# Patient Record
Sex: Female | Born: 1995
Health system: Southern US, Community
[De-identification: ages and names within clinical notes are randomized; demographics above are authoritative.]

## PROBLEM LIST (undated history)

## (undated) DIAGNOSIS — F32A Depression, unspecified: Secondary | ICD-10-CM

## (undated) DIAGNOSIS — E282 Polycystic ovarian syndrome: Secondary | ICD-10-CM

## (undated) HISTORY — PX: WISDOM TOOTH EXTRACTION: SHX21

---

## 2019-11-02 ENCOUNTER — Other Ambulatory Visit (HOSPITAL_COMMUNITY): Payer: Self-pay | Admitting: Physician Assistant

## 2020-04-23 ENCOUNTER — Other Ambulatory Visit (HOSPITAL_COMMUNITY): Payer: Self-pay | Admitting: Physician Assistant

## 2020-04-28 ENCOUNTER — Encounter: Payer: Self-pay | Admitting: Plastic Surgery

## 2020-04-28 ENCOUNTER — Ambulatory Visit (INDEPENDENT_AMBULATORY_CARE_PROVIDER_SITE_OTHER): Payer: No Typology Code available for payment source | Admitting: Plastic Surgery

## 2020-04-28 ENCOUNTER — Other Ambulatory Visit: Payer: Self-pay

## 2020-04-28 DIAGNOSIS — M542 Cervicalgia: Secondary | ICD-10-CM | POA: Diagnosis not present

## 2020-04-28 DIAGNOSIS — G8929 Other chronic pain: Secondary | ICD-10-CM

## 2020-04-28 DIAGNOSIS — M546 Pain in thoracic spine: Secondary | ICD-10-CM

## 2020-04-28 DIAGNOSIS — N62 Hypertrophy of breast: Secondary | ICD-10-CM | POA: Diagnosis not present

## 2020-04-28 DIAGNOSIS — M549 Dorsalgia, unspecified: Secondary | ICD-10-CM | POA: Insufficient documentation

## 2020-04-28 NOTE — Progress Notes (Signed)
Patient ID: Ashley Page, female    DOB: 1996-01-02, 24 y.o.   MRN: [id number]   Chief Complaint  Patient presents with  . Breast Problem    Mammary Hyperplasia: The patient is a 24 y.o. female with a history of mammary hyperplasia for several years.  She has extremely large breasts causing symptoms that include the following: Back pain in the upper and lower back, including neck pain. She pulls or pins her bra straps to provide better lift and relief of the pressure and pain. She notices relief by holding her breast up manually.  Her shoulder straps cause grooves and pain and pressure that requires padding for relief. Pain medication is sometimes required with motrin and tylenol.  Activities that are hindered by enlarged breasts include: exercise and running.  She has tried supportive clothing as well as fitted bras without improvement.  Her breasts are extremely large and fairly symmetric.  She has hyperpigmentation of the inframammary area on both sides.  The sternal to nipple distance on the right is 42 cm and the left is 41 cm.  The IMF distance is 13 cm.  She is 5 feet 4 inches tall and weighs 215 pounds.  Preoperative bra size = 38H (N) cup.  The estimated excess breast tissue to be removed at the time of surgery = 630 grams on the left and 630 grams on the right.  Mammogram history: none.  Family history of breast cancer:  none.  Tobacco use:  None and no DM.  She has grade III ptosis.  No masses or areas of concern.  Frequent rashes that require creams and powder.    Review of Systems  Constitutional: Positive for activity change. Negative for appetite change.  HENT: Negative.   Eyes: Negative.   Respiratory: Negative.  Negative for chest tightness and shortness of breath.   Cardiovascular: Negative for leg swelling.  Gastrointestinal: Negative for abdominal distention and abdominal pain.  Endocrine: Negative.   Genitourinary: Negative.   Musculoskeletal: Positive for back  pain and neck pain.  Skin: Positive for rash. Negative for pallor and wound.  Neurological: Negative.   Hematological: Negative.   Psychiatric/Behavioral: Negative.     History reviewed. No pertinent past medical history.  History reviewed. No pertinent surgical history.    Current Outpatient Medications:  .  levonorgestrel-ethinyl estradiol (ALESSE) 0.1-20 MG-MCG tablet, Take 1 tablet by mouth daily., Disp: , Rfl:  .  sertraline (ZOLOFT) 50 MG tablet, Take 50 mg by mouth daily., Disp: , Rfl:    Objective:   Vitals:   04/28/20 0832  BP: 115/80  Pulse: 80  Temp: 98.4 F (36.9 C)  SpO2: 98%    Physical Exam Vitals and nursing note reviewed.  Constitutional:      Appearance: Normal appearance.  HENT:     Head: Normocephalic and atraumatic.  Eyes:     Extraocular Movements: Extraocular movements intact.  Cardiovascular:     Rate and Rhythm: Normal rate.     Pulses: Normal pulses.  Pulmonary:     Effort: Pulmonary effort is normal. No respiratory distress.  Abdominal:     General: Abdomen is flat.  Skin:    General: Skin is warm.  Neurological:     General: No focal deficit present.     Mental Status: She is alert and oriented to person, place, and time.  Psychiatric:        Mood and Affect: Mood normal.        Behavior:  Behavior normal.        Thought Content: Thought content normal.        Judgment: Judgment normal.     Assessment & Plan:  Neck pain  Chronic bilateral thoracic back pain  Symptomatic mammary hypertrophy  The patient is a good candidate for breast reduction with possible liposuction.  We will send her to physical therapy and she knows to call the office when she has completed it.  We discussed that she may have nipple areola sensation changes and an inability to breast-feed in the future with any breast surgery.  Patient is aware and this does not deter her.  We will send her the breast reduction pamphlet.  Pictures were obtained of the  patient and placed in the chart with the patient's or guardian's permission.   Alena Bills Deronda Christian, DO

## 2020-04-29 ENCOUNTER — Institutional Professional Consult (permissible substitution): Payer: Self-pay | Admitting: Plastic Surgery

## 2020-05-20 ENCOUNTER — Ambulatory Visit: Payer: No Typology Code available for payment source | Attending: Plastic Surgery | Admitting: Physical Therapy

## 2020-05-20 ENCOUNTER — Encounter: Payer: Self-pay | Admitting: Physical Therapy

## 2020-05-20 ENCOUNTER — Other Ambulatory Visit: Payer: Self-pay

## 2020-05-20 DIAGNOSIS — M546 Pain in thoracic spine: Secondary | ICD-10-CM | POA: Insufficient documentation

## 2020-05-20 DIAGNOSIS — M542 Cervicalgia: Secondary | ICD-10-CM | POA: Insufficient documentation

## 2020-05-20 DIAGNOSIS — M6281 Muscle weakness (generalized): Secondary | ICD-10-CM | POA: Insufficient documentation

## 2020-05-20 NOTE — Therapy (Addendum)
[facility] [address] [place], [place], [address] Phone: [phone]   Fax:  [phone]  Physical Therapy Evaluation  Patient Details  Name: Ashley Page MRN: [id number] Date of Birth: 12-29-1995 Referring Provider (PT): Peggye Form, DO    Encounter Date: 05/20/2020   PT End of Session - 05/20/20 0924    Visit Number 1    Number of Visits 6    Authorization Type [facility] Focus plan    Authorization - Number of Visits --    PT Start Time 0845    PT Stop Time 0925    PT Time Calculation (min) 40 min    Activity Tolerance Patient tolerated treatment well    Behavior During Therapy [facility] for tasks assessed/performed           History reviewed. No pertinent past medical history.  History reviewed. No pertinent surgical history.  There were no vitals filed for this visit.    Subjective Assessment - 05/20/20 0849    Subjective "Scheduled to have mammary hypertrophy surgery. I'm Having a lot of back pain in the middle of my back, as well as shoulder pain. hurts worst after work."    Patient Stated Goals Reduce pain, be able to resume personal exercise program    Currently in Pain? Yes    Pain Score 4    At best 0/10, at worst 7/10 usually after work   Pain Location Back    Pain Orientation Right;Left;Mid    Pain Descriptors / Indicators Aching    Pain Type Chronic pain    Pain Onset More than a month ago   Approx a year ago, worsening over time.   Pain Frequency Constant    Aggravating Factors  Working    Pain Relieving Factors Laying down              [facility] PT Assessment - 05/20/20 0001      Assessment   Medical Diagnosis Neck pain M54.2, Chronic bilateral thoracic back pain M54.6, G89.29, Symptomatic mammary hypertrophy N62    Referring Provider (PT) Dillingham, Alena Bills, DO     Onset Date/Surgical Date --   1 year ago   Hand Dominance Right    Next MD Visit approximately 3 weeks    Prior Therapy No        Precautions   Precautions None      Restrictions   Weight Bearing Restrictions No      Balance Screen   Has the patient fallen in the past 6 months No    Has the patient had a decrease in activity level because of a fear of falling?  No    Is the patient reluctant to leave their home because of a fear of falling?  No      Home Nurse, mental health Private residence    Living Arrangements Parent    Available Help at Discharge Family    Type of Home Apartment    Home Access Level entry    Home Layout Two level    Alternate Level Stairs-Number of Steps 16    Alternate Level Stairs-Rails Right      Prior Function   Level of Independence Independent    Vocation Full time employment    Art therapist, standing, twisting, lifting, fine hand movements    Leisure Drawing/sketching, would like to exercise more       Cognition   Overall Cognitive Status Within Functional Limits for tasks  assessed      Observation/Other Assessments   Focus on Therapeutic Outcomes (FOTO)  not required      Posture/Postural Control   Posture/Postural Control Postural limitations    Postural Limitations Rounded Shoulders;Forward head;Decreased thoracic kyphosis      ROM / Strength   AROM / PROM / Strength AROM;Strength      AROM   Overall AROM  Within functional limits for tasks performed    AROM Assessment Site Cervical;Shoulder    Right/Left Shoulder --    Cervical Flexion 60    Cervical Extension 60    Cervical - Right Side Bend 50    Cervical - Left Side Bend 40    Cervical - Right Rotation 65    Cervical - Left Rotation 65      Strength   Overall Strength Within functional limits for tasks performed    Strength Assessment Site Shoulder    Right/Left Shoulder Right;Left    Right Shoulder Flexion 5/5    Right Shoulder Extension 5/5    Right Shoulder ABduction 4/5    Right Shoulder Internal Rotation 4+/5    Right Shoulder External Rotation 4+/5    Left  Shoulder Flexion 5/5    Left Shoulder Extension 5/5    Left Shoulder ABduction 4+/5    Left Shoulder Internal Rotation 4+/5    Left Shoulder External Rotation 4+/5      Palpation   Palpation comment TTP to bilateral UT, LS, and supraspinatus                      Objective measurements completed on examination: See above findings.       [facility] Adult PT Treatment/Exercise - 05/20/20 0001      Self-Care   Self-Care Other Self-Care Comments    Other Self-Care Comments  Tennis ball self-TP release with wall for UT, rhomboid, LS      Exercises   Exercises Neck      Shoulder Exercises: Seated   Retraction AROM;Strengthening;Both;10 reps    Retraction Limitations Scapular retractions, VC for down and in    External Rotation AROM;Strengthening;Left;20 reps;Theraband    Theraband Level (Shoulder External Rotation) Level 2 (Red)      Shoulder Exercises: Stretch   Other Shoulder Stretches Rhomboid clasped hands stretch 2x30s; Doorway rhomboid stretch 1x30s      Manual Therapy   Manual Therapy Soft tissue mobilization    Soft tissue mobilization Bilateral UT TP release      Neck Exercises: Stretches   Upper Trapezius Stretch 1 rep;Right;Left;30 seconds    Levator Stretch Right;Left;1 rep;30 seconds                  PT Education - 05/20/20 1126    Education Details Process of physical therapy, reviewed HEP, importance of stretching after work    [employer]) Educated Patient    Methods Explanation    Comprehension Verbalized understanding            PT Short Term Goals - 05/20/20 1136      PT SHORT TERM GOAL #1   Title Pt will be IND with HEP    Time 3    Status New    Target Date 06/10/20      PT SHORT TERM GOAL #2   Title --    Time --    Period --    Status --    Target Date --  PT Long Term Goals - 05/20/20 1138      PT LONG TERM GOAL #1   Title Pt will be IND with all provided HEP in order to resume personal workout  routine    Time 6    Period Weeks    Target Date 07/01/20      PT LONG TERM GOAL #2   Title Pt will improve gross shoulder strength bilaterally to >/= 4+/5 to improve work tolerance    Time 6    Period Weeks    Status New    Target Date 07/01/20      PT LONG TERM GOAL #3   Title Pt will be educated on and demonstrate improved shoulder and thoracic spine posture and functional mobility to improve work ability and quality of life    Time 6    Period Weeks    Status New    Target Date 07/01/20      PT LONG TERM GOAL #4   Title Pt will report reduced pain after work </=5/10 pain to promote functional independence with ADLs    Time 6    Period Weeks    Status New    Target Date 07/01/20                  Plan - 05/20/20 1130    Clinical Impression Statement Pt is a 24 year-old female who presents to OPPT prior to mammary hypertrophy surgery with neck, shoulder, and back pain of approximately 1 year in duration. Pt demonstrates cervical, shoulder, and lumbar ROM that is within functional limits and some gross shoulder weakness bilaterally. Increased muscle tension in posterior scapulothoracic musculature with notable trigger points bilaterally was noted. Pt will benefit from therapy for postural education and training, increase strength, and reduce pain.    Examination-Activity Limitations Other;Caring for Others   Unable to perform personal workout routine   Stability/Clinical Decision Making Stable/Uncomplicated    Clinical Decision Making Low    Rehab Potential Good    PT Frequency 1x / week    PT Duration 6 weeks    PT Treatment/Interventions ADLs/Self Care Home Management;Cryotherapy;Electrical Stimulation;Moist Heat;Iontophoresis 4mg /ml Dexamethasone;Functional mobility training;Therapeutic activities;Therapeutic exercise;Manual techniques;Patient/family education;Dry needling;Joint Manipulations;Spinal Manipulations;Vasopneumatic Device    PT Next Visit Plan Review  HEP/progress, discuss goals of personal exercise plan (gym? aerobic? weight managment?) and educate    PT Home Exercise Plan [id number] - UT and LS stretches, scapular retraction, ER with red band    Consulted and Agree with Plan of Care Patient           Patient will benefit from skilled therapeutic intervention in order to improve the following deficits and impairments:  Decreased activity tolerance, Decreased strength, Increased muscle spasms, Impaired perceived functional ability, Postural dysfunction, Improper body mechanics, Pain  Visit Diagnosis: Cervicalgia  Pain in thoracic spine  Muscle weakness (generalized)     Problem List Patient Active Problem List   Diagnosis Date Noted  . Neck pain 04/28/2020  . Back pain 04/28/2020  . Symptomatic mammary hypertrophy 04/28/2020    13/01/2020, SPT 05/20/2020, 12:42 PM  [facility] [address] [place], [place], [place] Phone: [phone]   Fax:  [phone]  Name: Ashley Page MRN: Jonetta Speak Date of Birth: [birth year]

## 2020-05-26 ENCOUNTER — Ambulatory Visit: Payer: No Typology Code available for payment source | Attending: Plastic Surgery

## 2020-05-26 ENCOUNTER — Other Ambulatory Visit: Payer: Self-pay

## 2020-05-26 DIAGNOSIS — M546 Pain in thoracic spine: Secondary | ICD-10-CM | POA: Insufficient documentation

## 2020-05-26 DIAGNOSIS — M542 Cervicalgia: Secondary | ICD-10-CM | POA: Insufficient documentation

## 2020-05-26 DIAGNOSIS — M6281 Muscle weakness (generalized): Secondary | ICD-10-CM | POA: Diagnosis present

## 2020-05-26 NOTE — Therapy (Signed)
[facility] Rehabilitation [facility] [address] [place], [place], [address] Phone: [phone]   Fax:  [phone]  Physical Therapy Treatment  Patient Details  Name: Melane Windholz MRN: [id number] Date of Birth: 01-Nov-1995 Referring Provider (PT): Peggye Form, DO    Encounter Date: 05/26/2020   PT End of Session - 05/26/20 1129    Visit Number 2    Number of Visits 6    Authorization Type [facility] Focus plan    PT Start Time 1130    PT Stop Time 1210    PT Time Calculation (min) 40 min    Activity Tolerance Patient tolerated treatment well    Behavior During Therapy [facility] for tasks assessed/performed           History reviewed. No pertinent past medical history.  History reviewed. No pertinent surgical history.  There were no vitals filed for this visit.   Subjective Assessment - 05/26/20 1128    Subjective Patient denies pain aside from "maybe between a 0 and 1/10 because I can just tell it's there" in her mid upper thoracic spine. She verbalizes compliance with HEP and expresses having relief of pain and some soreness after performing interventions.    Patient Stated Goals Reduce pain, be able to resume personal exercise program    Currently in Pain? Yes    Pain Score 1     Pain Location Back    Pain Orientation Mid;Right;Left    Pain Descriptors / Indicators Aching    Pain Type Chronic pain    Pain Onset More than a month ago   Approx a year ago, worsening over time.             [facility] PT Assessment - 05/26/20 0001      Assessment   Medical Diagnosis Neck pain M54.2, Chronic bilateral thoracic back pain M54.6, G89.29, Symptomatic mammary hypertrophy N62    Referring Provider (PT) Dillingham, Alena Bills, DO                          [facility] Adult PT Treatment/Exercise - 05/26/20 0001      Self-Care   Self-Care Other Self-Care Comments    Other Self-Care Comments  Reviewed HEP      Neck Exercises: Machines for  Strengthening   UBE (Upper Arm Bike) L2.5 x 2 min forward and 2 min backward      Neck Exercises: Prone   Other Prone Exercise Quadruped thread the needle x 10 each UE with cues for core activation and posture    Other Prone Exercise Quadruped cat/camel x 15      Shoulder Exercises: Seated   Retraction AROM;Strengthening;Both;10 reps    Retraction Limitations Scapular retractions, VC for down and in      Shoulder Exercises: Standing   Protraction AROM;Strengthening;Both;20 reps    Protraction Limitations Plantigrade position against counter for push up plus x 20    External Rotation AROM;Strengthening;Both;20 reps;Theraband    Theraband Level (Shoulder External Rotation) Level 2 (Red)    External Rotation Limitations Against wall    Extension AROM;Strengthening;Both;20 reps;Theraband    Theraband Level (Shoulder Extension) Level 3 (Green)    Extension Limitations Cued to perform scapular retraction with extension    Row AROM;Strengthening;Both;20 reps;Theraband    Theraband Level (Shoulder Row) Level 3 (Green)    Other Standing Exercises Serratus anterior wall slides with roller x 20    Other Standing Exercises Pallof press with rotation using green  theraband 10x each direction for core strengthening      Shoulder Exercises: Stretch   Other Shoulder Stretches Doorway rhomboid stretch 2 x 30 sec      Manual Therapy   Manual Therapy Soft tissue mobilization    Manual therapy comments Myofascial release of R rhomboid    Soft tissue mobilization STM B upper traps and L levator scap and passive B upper trap stretch x 30 sec each      Neck Exercises: Stretches   Corner Stretch 2 reps;30 seconds                  PT Education - 05/26/20 1239    Education Details Reviewed HEP and encouraged pt to perform 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    Person(s) Educated Patient    Methods  Explanation;Demonstration    Comprehension Verbalized understanding;Returned demonstration            PT Short Term Goals - 05/20/20 1136      PT SHORT TERM GOAL #1   Title Pt will be IND with HEP    Time 3    Status New    Target Date 06/10/20      PT SHORT TERM GOAL #2   Title --    Time --    Period --    Status --    Target Date --             PT Long Term Goals - 05/20/20 1138      PT LONG TERM GOAL #1   Title Pt will be IND with all provided HEP in order to resume personal workout routine    Time 6    Period Weeks    Target Date 07/01/20      PT LONG TERM GOAL #2   Title Pt will improve gross shoulder strength bilaterally to >/= 4+/5 to improve work tolerance    Time 6    Period Weeks    Status New    Target Date 07/01/20      PT LONG TERM GOAL #3   Title Pt will be educated on and demonstrate improved shoulder and thoracic spine posture and functional mobility to improve work ability and quality of life    Time 6    Period Weeks    Status New    Target Date 07/01/20      PT LONG TERM GOAL #4   Title Pt will report reduced pain after work </=5/10 pain to promote functional independence with ADLs    Time 6    Period Weeks    Status New    Target Date 07/01/20                 Plan - 05/26/20 1129    Clinical Impression Statement Patient reports her pain that was present upon arrival eased while performing interventions. She tolerated addition of exercises well with no complaints of pain. She will continue to benefit from postural education and periscapular strengthening for improved tolerance and reduced symptoms during work shifts.    Examination-Activity Limitations Other;Caring for Others   Unable to perform personal workout routine   Stability/Clinical Decision Making Stable/Uncomplicated    Rehab Potential Good    PT Frequency 1x / week    PT Duration 6 weeks    PT Treatment/Interventions ADLs/Self Care Home  Management;Cryotherapy;Electrical Stimulation;Moist Heat;Iontophoresis 4mg /ml Dexamethasone;Functional mobility training;Therapeutic activities;Therapeutic exercise;Manual techniques;Patient/family education;Dry  needling;Joint Manipulations;Spinal Manipulations;Vasopneumatic Device    PT Next Visit Plan Review HEP/progress (update next session and provide pt with green theraband), discuss goals of personal exercise plan (gym? aerobic? 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 Neck pain 04/28/2020  . Back pain 04/28/2020  . Symptomatic mammary hypertrophy 04/28/2020      Rhea Bleacher, PT, DPT 05/26/20 12:48 PM  [facility] [address] [place], [place], [address] Phone: [phone]   Fax:  [phone]  Name: Clela Hagadorn MRN: [id number] Date of Birth: [birth year]

## 2020-06-06 ENCOUNTER — Encounter: Payer: Self-pay | Admitting: Physical Therapy

## 2020-06-06 ENCOUNTER — Other Ambulatory Visit: Payer: Self-pay

## 2020-06-06 ENCOUNTER — Ambulatory Visit: Payer: No Typology Code available for payment source | Admitting: Physical Therapy

## 2020-06-06 DIAGNOSIS — M546 Pain in thoracic spine: Secondary | ICD-10-CM

## 2020-06-06 DIAGNOSIS — M542 Cervicalgia: Secondary | ICD-10-CM

## 2020-06-06 DIAGNOSIS — M6281 Muscle weakness (generalized): Secondary | ICD-10-CM

## 2020-06-06 NOTE — Therapy (Signed)
[facility] [address] [place], [place], [address] Phone: [phone]   Fax:  [phone]  Physical Therapy Treatment  Patient Details  Name: Ashley Page MRN: [id number] Date of Birth: [birth year] Referring Provider (PT): Peggye Form, DO    Encounter Date: 06/06/2020   PT End of Session - 06/06/20 0848    Visit Number 3    Number of Visits 6    Date for PT Re-Evaluation 07/01/20    Authorization Type [facility] Focus plan    PT Start Time 0848    PT Stop Time 0928    PT Time Calculation (min) 40 min    Activity Tolerance Patient tolerated treatment well    Behavior During Therapy [facility] for tasks assessed/performed           History reviewed. No pertinent past medical history.  History reviewed. No pertinent surgical history.  There were no vitals filed for this visit.   Subjective Assessment - 06/06/20 0849    Subjective " I think things are getting more calmed down. Work still gives me issue prolonged standing."    Patient Stated Goals Reduce pain, be able to resume personal exercise program    Currently in Pain? Yes    Pain Score 2     Pain Location Back    Pain Orientation Right;Left    Pain Descriptors / Indicators Aching    Pain Type Chronic pain    Pain Onset More than a month ago    Pain Frequency Intermittent    Aggravating Factors  working, and getting out of bed quickly.    Pain Relieving Factors laying down              [facility] PT Assessment - 06/06/20 0001      Assessment   Medical Diagnosis Neck pain M54.2, Chronic bilateral thoracic back pain M54.6, G89.29, Symptomatic mammary hypertrophy N62    Referring Provider (PT) Dillingham, Alena Bills, DO                          [facility] Adult PT Treatment/Exercise - 06/06/20 0001      Self-Care   Other Self-Care Comments  how to perform trigger release and how it can be peformed using tools at home.      Neck Exercises: Machines for  Strengthening   UBE (Upper Arm Bike) --   fwd/bwd x 3 min ea.   Nustep L6 x UE/LE      Neck Exercises: Theraband   Shoulder Extension 15 reps;Blue   x 2 sets   Rows 15 reps;Blue   2 x15     Lumbar Exercises: Stretches   Other Lumbar Stretch Exercise low back stretch rolling hands out on physioball 2 x 30      Lumbar Exercises: Supine   Dead Bug 5 reps   10 seconds     Manual Therapy   Manual therapy comments MTPR along R upper trap / levator scapulae and rhomboids      Neck Exercises: Stretches   Upper Trapezius Stretch 1 rep;Left;Right    Levator Stretch 1 rep;Left;Right;30 seconds    Other Neck Stretches rhomboid stretch from door knob with hands crossed 2 x 30 sec                  PT Education - 06/06/20 0904    Education Details reviewed self trigger point release.    Person(s) Educated Patient  Methods Explanation;Verbal cues    Comprehension Verbalized understanding;Verbal cues required            PT Short Term Goals - 05/20/20 1136      PT SHORT TERM GOAL #1   Title Pt will be IND with HEP    Time 3    Status New    Target Date 06/10/20      PT SHORT TERM GOAL #2   Title --    Time --    Period --    Status --    Target Date --             PT Long Term Goals - 05/20/20 1138      PT LONG TERM GOAL #1   Title Pt will be IND with all provided HEP in order to resume personal workout routine    Time 6    Period Weeks    Target Date 07/01/20      PT LONG TERM GOAL #2   Title Pt will improve gross shoulder strength bilaterally to >/= 4+/5 to improve work tolerance    Time 6    Period Weeks    Status New    Target Date 07/01/20      PT LONG TERM GOAL #3   Title Pt will be educated on and demonstrate improved shoulder and thoracic spine posture and functional mobility to improve work ability and quality of life    Time 6    Period Weeks    Status New    Target Date 07/01/20      PT LONG TERM GOAL #4   Title Pt will report  reduced pain after work </=5/10 pain to promote functional independence with ADLs    Time 6    Period Weeks    Status New    Target Date 07/01/20                 Plan - 06/06/20 0914    Clinical Impression Statement pt reports that she feels she is making good progress with physical therapy. some soreness noted today due getting out of bed quickly. She responded well to trigger point release for upper trap / levator and rhomboids. continue focus on posterior chain strengthening which she continued to responde well to. end of session she reported decreased pain/ stiffness.    PT Treatment/Interventions ADLs/Self Care Home Management;Cryotherapy;Electrical Stimulation;Moist Heat;Iontophoresis 4mg /ml Dexamethasone;Functional mobility training;Therapeutic activities;Therapeutic exercise;Manual techniques;Patient/family education;Dry needling;Joint Manipulations;Spinal Manipulations;Vasopneumatic Device    PT Next Visit Plan Review HEP/progress (update next session and provide pt with green theraband), discuss goals of personal exercise plan (gym? aerobic? 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    PT Home Exercise Plan [id number] - UT and LS stretches, scapular retraction, ER with red band    Consulted and Agree with Plan of Care Patient           Patient will benefit from skilled therapeutic intervention in order to improve the following deficits and impairments:  Decreased activity tolerance,Decreased strength,Increased muscle spasms,Impaired perceived functional ability,Postural dysfunction,Improper body mechanics,Pain  Visit Diagnosis: Cervicalgia  Pain in thoracic spine  Muscle weakness (generalized)     Problem List Patient Active Problem List   Diagnosis Date Noted  . Neck pain 04/28/2020  . Back pain  04/28/2020  . Symptomatic mammary hypertrophy 04/28/2020   13/01/2020 PT,  DPT, LAT, ATC  06/06/20  9:31 AM      [facility] [address] [place], [place], [address] Phone: [phone]   Fax:  [phone]  Name: Morgann Woodburn MRN: [id number] Date of Birth: Sep 21, 1995

## 2020-06-12 ENCOUNTER — Other Ambulatory Visit: Payer: Self-pay

## 2020-06-12 ENCOUNTER — Ambulatory Visit: Payer: No Typology Code available for payment source | Admitting: Physical Therapy

## 2020-06-12 ENCOUNTER — Encounter: Payer: Self-pay | Admitting: Physical Therapy

## 2020-06-12 DIAGNOSIS — M546 Pain in thoracic spine: Secondary | ICD-10-CM

## 2020-06-12 DIAGNOSIS — M542 Cervicalgia: Secondary | ICD-10-CM

## 2020-06-12 DIAGNOSIS — M6281 Muscle weakness (generalized): Secondary | ICD-10-CM

## 2020-06-12 NOTE — Therapy (Signed)
[facility] [address] [place], [place], [address] Phone: [phone]   Fax:  [phone]  Physical Therapy Treatment  Patient Details  Name: Ashley Page MRN: [id number] Date of Birth: Nov 16, 1995 Referring Provider (PT): Peggye Form, DO    Encounter Date: 06/12/2020   PT End of Session - 06/12/20 1501    Visit Number 4    Number of Visits 6    Date for PT Re-Evaluation 07/01/20    Authorization Type [facility] Focus plan    PT Start Time 1501    PT Stop Time 1542    PT Time Calculation (min) 41 min    Activity Tolerance Patient tolerated treatment well           History reviewed. No pertinent past medical history.  History reviewed. No pertinent surgical history.  There were no vitals filed for this visit.   Subjective Assessment - 06/12/20 1501    Subjective "I am very tired with work,              [facility] PT Assessment - 06/12/20 0001      Assessment   Medical Diagnosis Neck pain M54.2, Chronic bilateral thoracic back pain M54.6, G89.29, Symptomatic mammary hypertrophy N62    Referring Provider (PT) Dillingham, Alena Bills, DO                          [facility] Adult PT Treatment/Exercise - 06/12/20 0001      Neck Exercises: Machines for Strengthening   UBE (Upper Arm Bike) L5 x 4 min backward      Neck Exercises: Standing   Other Standing Exercises thoracic extension self mob with bed sheet1 x 10   demonstration for porper form.     Shoulder Exercises: Standing   Extension 20 reps;Theraband    Theraband Level (Shoulder Extension) Level 4 (Blue)    Row Strengthening;20 reps;Theraband    Theraband Level (Shoulder Row) Level 4 (Blue)    Other Standing Exercises money against wall 2 x 20 with  blue theraband      Manual Therapy   Manual Therapy Joint mobilization    Manual therapy comments skilled palpation and monitoring of pt throughout TPDN    Joint Mobilization T3 - T8 PA grade III-IV     Soft tissue mobilization IASTM along bil thoraco lumbar parapsinals      Neck Exercises: Stretches   Upper Trapezius Stretch 1 rep;30 seconds    Levator Stretch 1 rep;30 seconds    Other Neck Stretches rhomboid stretch from door knob with hands crossed 2 x 30 sec            Trigger Point Dry Needling - 06/12/20 0001    Consent Given? Yes    Education Handout Provided Yes    Muscles Treated Back/Hip Thoracic multifidi    Electrical Stimulation Performed with Dry Needling Yes    E-stim with Dry Needling Details Frequency 20,x 10 min increasing intensity to tolerance    Thoracic multifidi response Twitch response elicited;Palpable increased muscle length   T7-T10 bil               PT Education - 06/12/20 1518    Education Details muscle anatomy and referral patterns. What TPDN is and benefits.    Person(s) Educated Patient    Methods Explanation;Verbal cues    Comprehension Verbalized understanding;Verbal cues required            PT Short Term  Goals - 05/20/20 1136      PT SHORT TERM GOAL #1   Title Pt will be IND with HEP    Time 3    Status New    Target Date 06/10/20      PT SHORT TERM GOAL #2   Title --    Time --    Period --    Status --    Target Date --             PT Long Term Goals - 05/20/20 1138      PT LONG TERM GOAL #1   Title Pt will be IND with all provided HEP in order to resume personal workout routine    Time 6    Period Weeks    Target Date 07/01/20      PT LONG TERM GOAL #2   Title Pt will improve gross shoulder strength bilaterally to >/= 4+/5 to improve work tolerance    Time 6    Period Weeks    Status New    Target Date 07/01/20      PT LONG TERM GOAL #3   Title Pt will be educated on and demonstrate improved shoulder and thoracic spine posture and functional mobility to improve work ability and quality of life    Time 6    Period Weeks    Status New    Target Date 07/01/20      PT LONG TERM GOAL #4   Title Pt  will report reduced pain after work </=5/10 pain to promote functional independence with ADLs    Time 6    Period Weeks    Status New    Target Date 07/01/20                 Plan - 06/12/20 1519    Clinical Impression Statement 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  Educated and consent was given for TPDN for bil thoracic paraspinals combined with E-stim followed with IASTM Techniques and Mobilization. continued working posterior shoulder strengthening to faciliate postural control.    PT Treatment/Interventions ADLs/Self Care Home Management;Cryotherapy;Electrical Stimulation;Moist Heat;Iontophoresis 4mg /ml Dexamethasone;Functional mobility training;Therapeutic activities;Therapeutic exercise;Manual techniques;Patient/family education;Dry needling;Joint Manipulations;Spinal Manipulations;Vasopneumatic Device    PT Next Visit Plan Review HEP/progress (update next session and provide pt with green theraband), discuss goals of personal exercise plan (gym? aerobic? 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    PT Home Exercise Plan [id number] - UT and LS stretches, scapular retraction, ER with red band    Consulted and Agree with Plan of Care Patient           Patient will benefit from skilled therapeutic intervention in order to improve the following deficits and impairments:  Decreased activity tolerance,Decreased strength,Increased muscle spasms,Impaired perceived functional ability,Postural dysfunction,Improper body mechanics,Pain  Visit Diagnosis: Pain in thoracic spine  Muscle weakness (generalized)  Cervicalgia     Problem List Patient Active Problem List   Diagnosis Date Noted  . Neck pain 04/28/2020  . Back pain 04/28/2020  . Symptomatic mammary hypertrophy 04/28/2020    13/01/2020 PT, DPT, LAT, ATC  06/12/20  3:44 PM      [facility] [address] [place], [place], [place] Phone: [phone]   Fax:  [phone]  Name: Ashley Page MRN: Jonetta Speak Date of Birth:  12/14/1995   

## 2020-06-19 ENCOUNTER — Ambulatory Visit: Payer: No Typology Code available for payment source | Admitting: Physical Therapy

## 2020-06-25 ENCOUNTER — Other Ambulatory Visit: Payer: Self-pay

## 2020-06-25 ENCOUNTER — Ambulatory Visit: Payer: No Typology Code available for payment source | Attending: Plastic Surgery | Admitting: Physical Therapy

## 2020-06-25 ENCOUNTER — Encounter: Payer: Self-pay | Admitting: Physical Therapy

## 2020-06-25 DIAGNOSIS — M542 Cervicalgia: Secondary | ICD-10-CM | POA: Diagnosis present

## 2020-06-25 DIAGNOSIS — M546 Pain in thoracic spine: Secondary | ICD-10-CM | POA: Diagnosis present

## 2020-06-25 DIAGNOSIS — M6281 Muscle weakness (generalized): Secondary | ICD-10-CM | POA: Insufficient documentation

## 2020-06-25 NOTE — Therapy (Signed)
[facility] [address] [place], [place], [address] Phone: [phone]   Fax:  [phone]  Physical Therapy Treatment  Patient Details  Name: Ashley Page MRN: [id number] Date of Birth: Nov 30, 1995 Referring Provider (PT): Peggye Form, DO    Encounter Date: 06/25/2020   PT End of Session - 06/25/20 0847    Visit Number 5    Number of Visits 6    Date for PT Re-Evaluation 07/01/20    Authorization Type [facility] Focus plan    PT Start Time 0846    PT Stop Time 0927    PT Time Calculation (min) 41 min    Activity Tolerance Patient tolerated treatment well    Behavior During Therapy [facility] for tasks assessed/performed           History reviewed. No pertinent past medical history.  History reviewed. No pertinent surgical history.  There were no vitals filed for this visit.   Subjective Assessment - 06/25/20 0847    Subjective "I had my first day of a new job yesterday. I haven't been sleeping well so my back has been giving me issues."    Patient Stated Goals Reduce pain, be able to resume personal exercise program    Currently in Pain? Yes    Pain Score 2     Pain Location Back    Pain Orientation Right;Left    Pain Descriptors / Indicators Aching    Pain Type Chronic pain    Pain Onset More than a month ago    Pain Frequency Intermittent    Aggravating Factors  sleeping    Pain Relieving Factors laying              [facility] PT Assessment - 06/25/20 0001      Assessment   Medical Diagnosis Neck pain M54.2, Chronic bilateral thoracic back pain M54.6, G89.29, Symptomatic mammary hypertrophy N62    Referring Provider (PT) Dillingham, Alena Bills, DO                          [facility] Adult PT Treatment/Exercise - 06/25/20 0001      Neck Exercises: Machines for Strengthening   UBE (Upper Arm Bike) L5 x 6 min   fwd/ bwd x 3 min     Neck Exercises: Theraband   Shoulder Extension --    Rows --    Other  Theraband Exercises money 1  x 15 blue   with controlled eccentrics     Neck Exercises: Supine   Other Supine Exercise thoracic extension over bolster 1 x 10   cavitation noted during exercise   Other Supine Exercise foam roll routine: ceiling punches, horizontal add/abduction, alternating ceiling punches, back stroke and X to Y 1 x 15 ea.      Shoulder Exercises: Standing   Other Standing Exercises standing lower trap strengthening 1 x 20 wall y's      Shoulder Exercises: Stretch   Other Shoulder Stretches upper trap stretch 1 x 30 seconds bil      Manual Therapy   Manual therapy comments trigger point release along rhomboids    Joint Mobilization T3 - T8 PA grade III-IV                    PT Short Term Goals - 05/20/20 1136      PT SHORT TERM GOAL #1   Title Pt will be IND with HEP    Time  3    Status New    Target Date 06/10/20      PT SHORT TERM GOAL #2   Title --    Time --    Period --    Status --    Target Date --             PT Long Term Goals - 05/20/20 1138      PT LONG TERM GOAL #1   Title Pt will be IND with all provided HEP in order to resume personal workout routine    Time 6    Period Weeks    Target Date 07/01/20      PT LONG TERM GOAL #2   Title Pt will improve gross shoulder strength bilaterally to >/= 4+/5 to improve work tolerance    Time 6    Period Weeks    Status New    Target Date 07/01/20      PT LONG TERM GOAL #3   Title Pt will be educated on and demonstrate improved shoulder and thoracic spine posture and functional mobility to improve work ability and quality of life    Time 6    Period Weeks    Status New    Target Date 07/01/20      PT LONG TERM GOAL #4   Title Pt will report reduced pain after work </=5/10 pain to promote functional independence with ADLs    Time 6    Period Weeks    Status New    Target Date 07/01/20                 Plan - 06/25/20 0913    Clinical Impression Statement pt reports  starting a new job but notes continued mid to low back pain secondary to personal issues. continued utilizing self trigger point release techniques and stretching which she reported relief of tension. continued working posterior chain strengthening and utilized foam roll routnine to work on scapulothoracic mobility. plan next session to review / finalize HEP, address any questions  and discharge    PT Treatment/Interventions ADLs/Self Care Home Management;Cryotherapy;Electrical Stimulation;Moist Heat;Iontophoresis 4mg /ml Dexamethasone;Functional mobility training;Therapeutic activities;Therapeutic exercise;Manual techniques;Patient/family education;Dry needling;Joint Manipulations;Spinal Manipulations;Vasopneumatic Device    PT Next Visit Plan review/ update HEP, goals, any questions, anticipate discharge.    PT Home Exercise Plan [id number] - UT and LS stretches, scapular retraction, ER with red band    Consulted and Agree with Plan of Care Patient           Patient will benefit from skilled therapeutic intervention in order to improve the following deficits and impairments:  Decreased activity tolerance,Decreased strength,Increased muscle spasms,Impaired perceived functional ability,Postural dysfunction,Improper body mechanics,Pain  Visit Diagnosis: Pain in thoracic spine  Muscle weakness (generalized)  Cervicalgia     Problem List Patient Active Problem List   Diagnosis Date Noted  . Neck pain 04/28/2020  . Back pain 04/28/2020  . Symptomatic mammary hypertrophy 04/28/2020    13/01/2020 PT, DPT, LAT, ATC  06/25/20  9:28 AM      [facility] [address] [place], [place], [place] Phone: [phone]   Fax:  [phone]  Name: Ashley Page MRN: Jonetta Speak Date of Birth: 10/24/1995

## 2020-06-30 ENCOUNTER — Encounter: Payer: Self-pay | Admitting: Physical Therapy

## 2020-06-30 ENCOUNTER — Ambulatory Visit: Payer: No Typology Code available for payment source | Admitting: Physical Therapy

## 2020-06-30 ENCOUNTER — Other Ambulatory Visit: Payer: Self-pay

## 2020-06-30 DIAGNOSIS — M546 Pain in thoracic spine: Secondary | ICD-10-CM | POA: Diagnosis not present

## 2020-06-30 DIAGNOSIS — M542 Cervicalgia: Secondary | ICD-10-CM

## 2020-06-30 DIAGNOSIS — M6281 Muscle weakness (generalized): Secondary | ICD-10-CM

## 2020-06-30 NOTE — Therapy (Signed)
[place], [place], [address] Phone: [phone]   Fax:  [phone]  Physical Therapy Treatment  Patient Details  Name: Ashley Page MRN: [id number] Date of Birth: 1996/04/25 Referring Provider (PT): Wallace Going, DO     Encounter Date: 06/30/2020   PT End of Session - 06/30/20 0853    Visit Number 6    Number of Visits 6    Date for PT Re-Evaluation 07/01/20    Authorization Type [facility] Focus plan    PT Start Time 0845    PT Stop Time 0925    PT Time Calculation (min) 40 min    Activity Tolerance Patient tolerated treatment well    Behavior During Therapy [facility] for tasks assessed/performed           History reviewed. No pertinent past medical history.  History reviewed. No pertinent surgical history.  There were no vitals filed for this visit.   Subjective Assessment - 06/30/20 0850    Subjective Patient reports no complaints. She had no pain over the weekend.    Patient Stated Goals Reduce pain, be able to resume personal exercise program    Currently in Pain? No/denies                             [facility] Adult PT Treatment/Exercise - 06/30/20 0001      Self-Care   Other Self-Care Comments  reviewed how to use HEP at home.      Neck Exercises: Seated   Other Seated Exercise standing Y at the wall x15      Neck Exercises: Supine   Other Supine Exercise foam roll routine: ceiling punches, horizontal add/abduction, alternating ceiling punches, back stroke and X to Y 1 x 15 ea.      Shoulder Exercises: Standing   Extension 20 reps;Theraband    Theraband Level (Shoulder Extension) Level 4 (Blue)    Row Strengthening;20 reps;Theraband    Theraband Level (Shoulder Row) Level 4 (Blue)    Other Standing Exercises standing lower trap strengthening 1 x 20 wall y's      Neck Exercises: Stretches   Upper Trapezius Stretch 1 rep;30 seconds    Levator Stretch 1 rep;30  seconds    Corner Stretch 2 reps;30 seconds    Other Neck Stretches posterior capsule 2x20 sec hold                  PT Education - 06/30/20 0853    Education Details reviewed final HEP    Person(s) Educated Patient    Methods Demonstration;Explanation;Tactile cues;Verbal cues    Comprehension Returned demonstration;Verbal cues required;Verbalized understanding;Tactile cues required            PT Short Term Goals - 06/30/20 0911      PT SHORT TERM GOAL #1   Title Pt will be IND with HEP    Baseline independent with bsse HEP    Time 3    Period Weeks    Status Achieved    Target Date 06/10/20             PT Long Term Goals - 06/30/20 0937      PT LONG TERM GOAL #1   Title Pt will be IND with all provided HEP in order to resume personal workout routine    Baseline has full HEP    Time 6    Period Weeks    Status  Achieved      PT LONG TERM GOAL #2   Title Pt will improve gross shoulder strength bilaterally to >/= 4+/5 to improve work tolerance    Baseline 4+/5 gross    Time 6    Period Weeks      PT LONG TERM GOAL #3   Title Pt will be educated on and demonstrate improved shoulder and thoracic spine posture and functional mobility to improve work ability and quality of life    Baseline feels like her posture is imprpved at work    Time 6    Period Weeks    Status Achieved      PT LONG TERM GOAL #4   Title Pt will report reduced pain after work </=5/10 pain to promote functional independence with ADLs    Baseline can still have pain but intensity better    Time 6    Status Partially Met                 Plan - 06/30/20 0854    Clinical Impression Statement Therapy reviewed complete HEP with patient. We reviewed stretches and sexercises to continue at home. She reports her pain has improved but she isstill having pain at times in her mid back with activty. She will be discharegd to HEP.    Examination-Activity Limitations Other;Caring for Others     Stability/Clinical Decision Making Stable/Uncomplicated    Clinical Decision Making Low    Rehab Potential Good    PT Frequency 1x / week    PT Duration 6 weeks    PT Treatment/Interventions ADLs/Self Care Home Management;Cryotherapy;Electrical Stimulation;Moist Heat;Iontophoresis 4mg /ml Dexamethasone;Functional mobility training;Therapeutic activities;Therapeutic exercise;Manual techniques;Patient/family education;Dry needling;Joint Manipulations;Spinal Manipulations;Vasopneumatic Device    PT Next Visit Plan review/ update HEP, goals, any questions, anticipate discharge.    PT Home Exercise Plan [id number] - UT and LS stretches, scapular retraction, ER with red band    Consulted and Agree with Plan of Care Patient           Patient will benefit from skilled therapeutic intervention in order to improve the following deficits and impairments:  Decreased activity tolerance,Decreased strength,Increased muscle spasms,Impaired perceived functional ability,Postural dysfunction,Improper body mechanics,Pain  Visit Diagnosis: Pain in thoracic spine  Muscle weakness (generalized)  Cervicalgia  PHYSICAL THERAPY DISCHARGE SUMMARY  Visits from Start of Care: 6  Current functional level related to goals / functional outcomes: Decreased intensity of pain with work activity   Remaining deficits: Continues to have pain at work    Education / Equipment: HEP   Plan: Patient agrees to discharge.  Patient goals were met. Patient is being discharged due to meeting the stated rehab goals.  ?????       Problem List Patient Active Problem List   Diagnosis Date Noted  . Neck pain 04/28/2020  . Back pain 04/28/2020  . Symptomatic mammary hypertrophy 04/28/2020    Carney Living PT DPT  06/30/2020, 10:04 AM  [facility] [address] [place], [place], [address] Phone: [phone]   Fax:  [phone]  Name: Ashley Page MRN:  [id number] Date of Birth: June 16, 1996

## 2020-07-24 NOTE — Progress Notes (Signed)
Patient is a 25 yr-old female here for follow-up after a consultation for bilateral breast reduction on 04/28/20 with Dr. Ulice Bold. She is 5 feet 4 inches and at that visit weighed 215 lbs.  Since then she has completed 6 PT visits: 11/30, 12/6, 12/17, 12/23, 1/5, and 1/10 without resolution of her symptoms.  Today she weighs 213.6 lbs. She is still interested in having the breast reduction. She believes she is currently an N cup and would like to be a DD - DDD cup.

## 2020-07-25 ENCOUNTER — Other Ambulatory Visit: Payer: Self-pay

## 2020-07-25 ENCOUNTER — Encounter: Payer: Self-pay | Admitting: Plastic Surgery

## 2020-07-25 ENCOUNTER — Ambulatory Visit (INDEPENDENT_AMBULATORY_CARE_PROVIDER_SITE_OTHER): Payer: No Typology Code available for payment source | Admitting: Plastic Surgery

## 2020-07-25 VITALS — BP 97/65 | HR 85 | Wt 213.6 lb

## 2020-07-25 DIAGNOSIS — G8929 Other chronic pain: Secondary | ICD-10-CM

## 2020-07-25 DIAGNOSIS — M542 Cervicalgia: Secondary | ICD-10-CM | POA: Diagnosis not present

## 2020-07-25 DIAGNOSIS — N62 Hypertrophy of breast: Secondary | ICD-10-CM

## 2020-07-25 DIAGNOSIS — M546 Pain in thoracic spine: Secondary | ICD-10-CM

## 2020-08-13 ENCOUNTER — Encounter (HOSPITAL_COMMUNITY): Payer: Self-pay | Admitting: *Deleted

## 2020-08-13 ENCOUNTER — Other Ambulatory Visit: Payer: Self-pay

## 2020-08-13 ENCOUNTER — Ambulatory Visit (INDEPENDENT_AMBULATORY_CARE_PROVIDER_SITE_OTHER): Payer: No Typology Code available for payment source

## 2020-08-13 ENCOUNTER — Ambulatory Visit (HOSPITAL_COMMUNITY)
Admission: EM | Admit: 2020-08-13 | Discharge: 2020-08-13 | Disposition: A | Discharge status: Home / Self Care | Payer: No Typology Code available for payment source | Attending: Family Medicine | Admitting: Family Medicine

## 2020-08-13 DIAGNOSIS — M542 Cervicalgia: Secondary | ICD-10-CM | POA: Diagnosis not present

## 2020-08-13 DIAGNOSIS — M25571 Pain in right ankle and joints of right foot: Secondary | ICD-10-CM

## 2020-08-13 DIAGNOSIS — W19XXXA Unspecified fall, initial encounter: Secondary | ICD-10-CM

## 2020-08-13 DIAGNOSIS — S93491A Sprain of other ligament of right ankle, initial encounter: Secondary | ICD-10-CM

## 2020-08-13 DIAGNOSIS — M5459 Other low back pain: Secondary | ICD-10-CM | POA: Diagnosis not present

## 2020-08-13 DIAGNOSIS — N62 Hypertrophy of breast: Secondary | ICD-10-CM | POA: Diagnosis not present

## 2020-08-13 DIAGNOSIS — M954 Acquired deformity of chest and rib: Secondary | ICD-10-CM | POA: Diagnosis not present

## 2020-08-13 NOTE — Discharge Instructions (Signed)
If not allergic, you may use over the counter ibuprofen or acetaminophen as needed. ° °

## 2020-08-13 NOTE — ED Triage Notes (Signed)
Pt fell down stairs on Sunday and now has ankle pain. Pt arrived with a support sleeve on RT ankle .

## 2020-08-13 NOTE — ED Provider Notes (Addendum)
[facility]   [id number] 08/13/20 Arrival Time: 1433  ASSESSMENT & PLAN:  1. Sprain of anterior talofibular ligament of right ankle, initial encounter      Clinical Course as of 08/13/20 1631  Wed Aug 13, 2020  1630 DG Ankle Complete Right Noted. No fx observed. [BH]    Clinical Course User Index [BH] Mardella Layman, MD    See AVS for d/c information. Declines work note.   Discharge Instructions     If not allergic, you may use over the counter ibuprofen or acetaminophen as needed.       Orders Placed This Encounter  Procedures  . DG Ankle Complete Right  . Apply ASO lace-up ankle brace    Recommend:  Follow-up Information    [facility].   Why: If worsening or failing to improve as anticipated. Contact information: [address] [place] [address] [phone]              Reviewed expectations re: course of current medical issues. Questions answered. Outlined signs and symptoms indicating need for more acute intervention. Patient verbalized understanding. After Visit Summary given.  SUBJECTIVE: History from: patient. Ashley Page is a 25 y.o. female who reports persistent moderate pain of her right ankle; described as aching; without radiation. Onset: abrupt. First noted: 3 d ago. Injury/trama: reports tripping on stairs; inversion injury. Symptoms have progressed to a point and plateaued since beginning. Aggravating factors: certain movements and weight bearing. Alleviating factors: rest. Associated symptoms: none reported. Extremity sensation changes or weakness: none. Self treatment: has not tried OTC therapies.  History of similar: no.  History reviewed. No pertinent surgical history.    OBJECTIVE:  Vitals:   08/13/20 1515  BP: 106/66  Pulse: 80  Resp: 16  Temp: 98.1 F (36.7 C)  TempSrc: Oral  SpO2: 98%    General appearance: alert; no distress HEENT: [place];  AT Neck: supple with FROM Resp: unlabored respirations Extremities: RLE: warm with well perfused appearance; fairly well localized moderate tenderness over right ankle, ATFL distribution; without gross deformities; swelling: none; bruising: none; ankle ROM: normal with discomfort; distal sensation intact Skin: warm and dry; no visible rashes Neurologic: gait normal; normal sensation and strength of RLE Psychological: alert and cooperative; normal mood and affect  Imaging: DG Ankle Complete Right  Result Date: 08/13/2020 CLINICAL DATA:  Pain following fall EXAM: RIGHT ANKLE - COMPLETE 3+ VIEW COMPARISON:  None. FINDINGS: Frontal, oblique, and lateral views were obtained. There is no appreciable fracture or joint effusion. Joint spaces appear normal. No erosive change. Ankle mortise appears intact. IMPRESSION: No fracture or appreciable arthropathy. Ankle mortise appears intact. Electronically Signed   By: Bretta Bang III M.D.   On: 08/13/2020 15:41      No Known Allergies  History reviewed. No pertinent past medical history. Social History   Socioeconomic History  . Marital status: Single    Spouse name: Not on file  . Number of children: Not on file  . Years of education: Not on file  . Highest education level: Not on file  Occupational History  . Not on file  Tobacco Use  . Smoking status: Never Smoker  . Smokeless tobacco: Never Used  Substance and Sexual Activity  . Alcohol use: Not on file  . Drug use: Not on file  . Sexual activity: Not on file  Other Topics Concern  . Not on file  Social History Narrative  . Not on file  Social Determinants of Health   Financial Resource Strain: Not on file  Food Insecurity: Not on file  Transportation Needs: Not on file  Physical Activity: Not on file  Stress: Not on file  Social Connections: Not on file   History reviewed. No pertinent family history. History reviewed. No pertinent surgical history.    Mardella Layman, MD 08/13/20 [id number]    Mardella Layman, MD 08/13/20 [phone]

## 2020-08-26 ENCOUNTER — Other Ambulatory Visit: Payer: Self-pay

## 2020-08-26 ENCOUNTER — Ambulatory Visit (INDEPENDENT_AMBULATORY_CARE_PROVIDER_SITE_OTHER): Payer: No Typology Code available for payment source | Admitting: Surgical

## 2020-08-26 ENCOUNTER — Encounter: Payer: Self-pay | Admitting: Surgical

## 2020-08-26 ENCOUNTER — Other Ambulatory Visit: Payer: Self-pay | Admitting: Surgical

## 2020-08-26 VITALS — BP 113/77 | HR 75 | Ht 64.0 in | Wt 218.0 lb

## 2020-08-26 DIAGNOSIS — M542 Cervicalgia: Secondary | ICD-10-CM

## 2020-08-26 DIAGNOSIS — N62 Hypertrophy of breast: Secondary | ICD-10-CM

## 2020-08-26 DIAGNOSIS — M546 Pain in thoracic spine: Secondary | ICD-10-CM

## 2020-08-26 DIAGNOSIS — G8929 Other chronic pain: Secondary | ICD-10-CM

## 2020-08-26 MED ORDER — ONDANSETRON HCL 4 MG PO TABS: 4.0000 mg | ORAL_TABLET | Freq: Three times a day (TID) | ORAL | 0 refills | Status: DC | PRN

## 2020-08-26 MED ORDER — CEPHALEXIN 500 MG PO CAPS: 500.0000 mg | ORAL_CAPSULE | Freq: Four times a day (QID) | ORAL | 0 refills | Status: DC

## 2020-08-26 MED ORDER — HYDROCODONE-ACETAMINOPHEN 5-325 MG PO TABS: 1.0000 | ORAL_TABLET | Freq: Four times a day (QID) | ORAL | 0 refills | Status: DC | PRN

## 2020-08-26 NOTE — H&P (View-Only) (Signed)
Patient ID: Ashley Page, female    DOB: Dec 02, 1995, 25 y.o.   MRN: [id number]  Chief Complaint  Patient presents with  . Pre-op Exam      ICD-10-CM   1. Symptomatic mammary hypertrophy  N62   2. Chronic bilateral thoracic back pain  M54.6    G89.29   3. Neck pain  M54.2     History of Present Illness: Ashley Page is a 25 y.o.  female  with a history of macromastia.  She presents for preoperative evaluation for upcoming procedure, Bilateral Breast Reduction with possible liposuction, scheduled for 09/10/2020 with Dr.  Ulice Bold  The patient has not had problems with anesthesia. No history of DVT/PE.  No family history of DVT/PE.  No family or personal history of bleeding or clotting disorders.  Patient is not currently taking any blood thinners.  No history of CVA/MI.   Summary of Previous Visit: STN distance on the right is 42 cm and the left is 41 cm.  The IMF distance is 13 cm.  She is 5 feet 4 inches tall and weighs 215 pounds.  Preoperative bra size equals 38H (N) cup.  The estimated excess breast tissue to be removed at the time of surgery equals 630 g on the left and 630 g on the right.  She would like to be a double D/triple D postoperatively.  No history of tobacco use and no history of diabetes.  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 Patient is currently on OCPs   Past Medical History: Allergies: No Known Allergies  Current Medications:  Current Outpatient Medications:  .  levonorgestrel-ethinyl estradiol (ALESSE) 0.1-20 MG-MCG tablet, Take 1 tablet by mouth daily., Disp: , Rfl:  .  sertraline (ZOLOFT) 50 MG tablet, Take 50 mg by mouth daily., Disp: , Rfl:   Past Medical Problems: History reviewed. No pertinent past medical history.  Past Surgical History: History reviewed. No pertinent surgical history.  Social History: Social History   Socioeconomic History  . Marital status: Single    Spouse name: Not on file  . Number of  children: Not on file  . Years of education: Not on file  . Highest education level: Not on file  Occupational History  . Not on file  Tobacco Use  . Smoking status: Never Smoker  . Smokeless tobacco: Never Used  Substance and Sexual Activity  . Alcohol use: Not on file  . Drug use: Not on file  . Sexual activity: Not on file  Other Topics Concern  . Not on file  Social History Narrative  . Not on file   Social Determinants of Health   Financial Resource Strain: Not on file  Food Insecurity: Not on file  Transportation Needs: Not on file  Physical Activity: Not on file  Stress: Not on file  Social Connections: Not on file  Intimate Partner Violence: Not on file    Family History: History reviewed. No pertinent family history.  Review of Systems: Review of Systems  Constitutional: Negative.   Respiratory: Negative.   Cardiovascular: Negative.   Gastrointestinal: Negative.   Skin: Negative.   Neurological: Negative.     Physical Exam: Vital Signs There were no vitals taken for this visit.  Physical Exam Constitutional:      General: Not in acute distress.    Appearance: Normal appearance. Not ill-appearing.  HENT:     Head: Normocephalic and atraumatic.  Eyes:     Pupils: Pupils are equal, round  Neck:     Musculoskeletal: Normal range of motion.  Cardiovascular:     Rate and Rhythm: Normal rate and regular rhythm.     Pulses: Normal pulses.     Heart sounds: Normal heart sounds. No murmur.  Pulmonary:     Effort: Pulmonary effort is normal. No respiratory distress.     Breath sounds: Normal breath sounds. No wheezing.  Abdominal:     General: Abdomen is flat. There is no distension.     Palpations: Abdomen is soft.     Tenderness: There is no abdominal tenderness.  Musculoskeletal: Normal range of motion.  Skin:    General: Skin is warm and dry.     Findings: No erythema or rash.  Neurological:     General: No focal deficit present.     Mental  Status: Alert and oriented to person, place, and time. Mental status is at baseline.     Motor: No weakness.  Psychiatric:        Mood and Affect: Mood normal.        Behavior: Behavior normal.    Assessment/Plan: The patient is scheduled for bilateral breast reduction with possible liposuction with Dr. Ulice Bold.  Risks, benefits, and alternatives of procedure discussed, questions answered and consent obtained.    Smoking Status: Non-smoker; Counseling Given?  N/A Last Mammogram: No history  Caprini Score: 4, moderate; Risk Factors include: BMI greater than 25, on OCP and length of planned surgery. Recommendation for mechanical and pharmacological prophylaxis while hospitalized. Encourage early ambulation.   Pictures obtained:[email or web address]   Post-op Rx sent to pharmacy: Norco, Zofran, Keflex  Patient was provided with the breast reduction and General Surgical Risk consent document and Pain Medication Agreement prior to their appointment.  They had adequate time to read through the risk consent documents and Pain Medication Agreement. We also discussed them in person together during this preop appointment. All of their questions were answered to their satisfaction.  Recommended calling if they have any further questions.  Risk consent form and Pain Medication Agreement to be scanned into patient's chart.  The risk that can be encountered with breast reduction were discussed and include the following but not limited to these:  Breast asymmetry, fluid accumulation, firmness of the breast, inability to breast feed, loss of nipple or areola, skin loss, decrease or no nipple sensation, fat necrosis of the breast tissue, bleeding, infection, healing delay.  There are risks of anesthesia, changes to skin sensation and injury to nerves or blood vessels.  The muscle can be temporarily or permanently injured.  You may have an allergic reaction to tape, suture, glue, blood products which can result in skin  discoloration, swelling, pain, skin lesions, poor healing.  Any of these can lead to the need for revisonal surgery or stage procedures.  A reduction has potential to interfere with diagnostic procedures.  Nipple or breast piercing can increase risks of infection.  This procedure is best done when the breast is fully developed.  Changes in the breast will continue to occur over time.  Pregnancy can alter the outcomes of previous breast reduction surgery, weight gain and weigh loss can also effect the long term appearance.   We discussed the possibility of amputation/free nipple graft technique due to the length of her STN.  She is understanding of the possibility that we would need to transition from a pedicle technique to a free nipple graft technique intraoperatively.  We discussed the risks associated with free nipple graft breast reductions, including  but not limited to failure of the graft, partial loss of the graft, loss of sensation of bilateral nipple areola, complete loss of the nipple areola graft, inability to breast-feed, postoperative wounds, ongoing wound care.  We also discussed the risks associated with the pedicle technique.  We discussed that with the pedicle technique she could develop nipple areolar necrosis which would result in loss of the nipple, this would also result in ongoing wound care and possible changes in the shape of her breast.   After discussing the risks and complications of free nipple graft technique versus the pedicle technique patient is in agreement with proceeding with free nipple graft technique.  We discussed the postoperative instructions.    Electronically signed by: Kermit Balo Scheeler, PA-C 08/26/2020 1:43 PM

## 2020-08-26 NOTE — Progress Notes (Signed)
   Patient ID: Ashley Page, female    DOB: 12/22/1995, 24 y.o.   MRN: [id number]  Chief Complaint  Patient presents with  . Pre-op Exam      ICD-10-CM   1. Symptomatic mammary hypertrophy  N62   2. Chronic bilateral thoracic back pain  M54.6    G89.29   3. Neck pain  M54.2     History of Present Illness: Ashley Page is a 24 y.o.  female  with a history of macromastia.  She presents for preoperative evaluation for upcoming procedure, Bilateral Breast Reduction with possible liposuction, scheduled for 09/10/2020 with Dr.  Dillingham  The patient has not had problems with anesthesia. No history of DVT/PE.  No family history of DVT/PE.  No family or personal history of bleeding or clotting disorders.  Patient is not currently taking any blood thinners.  No history of CVA/MI.   Summary of Previous Visit: STN distance on the right is 42 cm and the left is 41 cm.  The IMF distance is 13 cm.  She is 5 feet 4 inches tall and weighs 215 pounds.  Preoperative bra size equals 38H (N) cup.  The estimated excess breast tissue to be removed at the time of surgery equals 630 g on the left and 630 g on the right.  She would like to be a double D/triple D postoperatively.  No history of tobacco use and no history of diabetes.  Job: Social worker  No significant past medical history. Patient is currently on OCPs   Past Medical History: Allergies: No Known Allergies  Current Medications:  Current Outpatient Medications:  .  levonorgestrel-ethinyl estradiol (ALESSE) 0.1-20 MG-MCG tablet, Take 1 tablet by mouth daily., Disp: , Rfl:  .  sertraline (ZOLOFT) 50 MG tablet, Take 50 mg by mouth daily., Disp: , Rfl:   Past Medical Problems: History reviewed. No pertinent past medical history.  Past Surgical History: History reviewed. No pertinent surgical history.  Social History: Social History   Socioeconomic History  . Marital status: Single    Spouse name: Not on file  . Number of  children: Not on file  . Years of education: Not on file  . Highest education level: Not on file  Occupational History  . Not on file  Tobacco Use  . Smoking status: Never Smoker  . Smokeless tobacco: Never Used  Substance and Sexual Activity  . Alcohol use: Not on file  . Drug use: Not on file  . Sexual activity: Not on file  Other Topics Concern  . Not on file  Social History Narrative  . Not on file   Social Determinants of Health   Financial Resource Strain: Not on file  Food Insecurity: Not on file  Transportation Needs: Not on file  Physical Activity: Not on file  Stress: Not on file  Social Connections: Not on file  Intimate Partner Violence: Not on file    Family History: History reviewed. No pertinent family history.  Review of Systems: Review of Systems  Constitutional: Negative.   Respiratory: Negative.   Cardiovascular: Negative.   Gastrointestinal: Negative.   Skin: Negative.   Neurological: Negative.     Physical Exam: Vital Signs There were no vitals taken for this visit.  Physical Exam Constitutional:      General: Not in acute distress.    Appearance: Normal appearance. Not ill-appearing.  HENT:     Head: Normocephalic and atraumatic.  Eyes:     Pupils: Pupils are equal, round   Neck:     Musculoskeletal: Normal range of motion.  Cardiovascular:     Rate and Rhythm: Normal rate and regular rhythm.     Pulses: Normal pulses.     Heart sounds: Normal heart sounds. No murmur.  Pulmonary:     Effort: Pulmonary effort is normal. No respiratory distress.     Breath sounds: Normal breath sounds. No wheezing.  Abdominal:     General: Abdomen is flat. There is no distension.     Palpations: Abdomen is soft.     Tenderness: There is no abdominal tenderness.  Musculoskeletal: Normal range of motion.  Skin:    General: Skin is warm and dry.     Findings: No erythema or rash.  Neurological:     General: No focal deficit present.     Mental  Status: Alert and oriented to person, place, and time. Mental status is at baseline.     Motor: No weakness.  Psychiatric:        Mood and Affect: Mood normal.        Behavior: Behavior normal.    Assessment/Plan: The patient is scheduled for bilateral breast reduction with possible liposuction with Dr. Ulice Bold.  Risks, benefits, and alternatives of procedure discussed, questions answered and consent obtained.    Smoking Status: Non-smoker; Counseling Given?  N/A Last Mammogram: No history  Caprini Score: 4, moderate; Risk Factors include: BMI greater than 25, on OCP and length of planned surgery. Recommendation for mechanical and pharmacological prophylaxis while hospitalized. Encourage early ambulation.   Pictures obtained:@Consult   Post-op Rx sent to pharmacy: Norco, Zofran, Keflex  Patient was provided with the breast reduction and General Surgical Risk consent document and Pain Medication Agreement prior to their appointment.  They had adequate time to read through the risk consent documents and Pain Medication Agreement. We also discussed them in person together during this preop appointment. All of their questions were answered to their satisfaction.  Recommended calling if they have any further questions.  Risk consent form and Pain Medication Agreement to be scanned into patient's chart.  The risk that can be encountered with breast reduction were discussed and include the following but not limited to these:  Breast asymmetry, fluid accumulation, firmness of the breast, inability to breast feed, loss of nipple or areola, skin loss, decrease or no nipple sensation, fat necrosis of the breast tissue, bleeding, infection, healing delay.  There are risks of anesthesia, changes to skin sensation and injury to nerves or blood vessels.  The muscle can be temporarily or permanently injured.  You may have an allergic reaction to tape, suture, glue, blood products which can result in skin  discoloration, swelling, pain, skin lesions, poor healing.  Any of these can lead to the need for revisonal surgery or stage procedures.  A reduction has potential to interfere with diagnostic procedures.  Nipple or breast piercing can increase risks of infection.  This procedure is best done when the breast is fully developed.  Changes in the breast will continue to occur over time.  Pregnancy can alter the outcomes of previous breast reduction surgery, weight gain and weigh loss can also effect the long term appearance.   We discussed the possibility of amputation/free nipple graft technique due to the length of her STN.  She is understanding of the possibility that we would need to transition from a pedicle technique to a free nipple graft technique intraoperatively.  We discussed the risks associated with free nipple graft breast reductions, including  but not limited to failure of the graft, partial loss of the graft, loss of sensation of bilateral nipple areola, complete loss of the nipple areola graft, inability to breast-feed, postoperative wounds, ongoing wound care.  We also discussed the risks associated with the pedicle technique.  We discussed that with the pedicle technique she could develop nipple areolar necrosis which would result in loss of the nipple, this would also result in ongoing wound care and possible changes in the shape of her breast.   After discussing the risks and complications of free nipple graft technique versus the pedicle technique patient is in agreement with proceeding with free nipple graft technique.  We discussed the postoperative instructions.    Electronically signed by: Kermit Balo Scheeler, PA-C 08/26/2020 1:43 PM

## 2020-09-04 ENCOUNTER — Other Ambulatory Visit: Payer: Self-pay

## 2020-09-04 ENCOUNTER — Encounter (HOSPITAL_BASED_OUTPATIENT_CLINIC_OR_DEPARTMENT_OTHER): Payer: Self-pay | Admitting: Plastic Surgery

## 2020-09-08 ENCOUNTER — Other Ambulatory Visit (HOSPITAL_COMMUNITY)
Admission: RE | Admit: 2020-09-08 | Discharge: 2020-09-08 | Disposition: A | Discharge status: Home / Self Care | Payer: No Typology Code available for payment source | Source: Ambulatory Visit | Attending: Plastic Surgery | Admitting: Plastic Surgery

## 2020-09-08 DIAGNOSIS — Z20822 Contact with and (suspected) exposure to covid-19: Secondary | ICD-10-CM | POA: Diagnosis not present

## 2020-09-08 DIAGNOSIS — Z01812 Encounter for preprocedural laboratory examination: Secondary | ICD-10-CM | POA: Diagnosis present

## 2020-09-09 LAB — SARS CORONAVIRUS 2 (TAT 6-24 HRS): SARS Coronavirus 2: NEGATIVE

## 2020-09-10 ENCOUNTER — Ambulatory Visit (HOSPITAL_BASED_OUTPATIENT_CLINIC_OR_DEPARTMENT_OTHER): Payer: No Typology Code available for payment source | Admitting: Anesthesiology

## 2020-09-10 ENCOUNTER — Ambulatory Visit (HOSPITAL_BASED_OUTPATIENT_CLINIC_OR_DEPARTMENT_OTHER)
Admission: RE | Admit: 2020-09-10 | Discharge: 2020-09-10 | Disposition: A | Discharge status: Home / Self Care | Payer: No Typology Code available for payment source | Attending: Plastic Surgery | Admitting: Plastic Surgery

## 2020-09-10 ENCOUNTER — Encounter (HOSPITAL_BASED_OUTPATIENT_CLINIC_OR_DEPARTMENT_OTHER): Payer: Self-pay | Admitting: Plastic Surgery

## 2020-09-10 ENCOUNTER — Encounter (HOSPITAL_BASED_OUTPATIENT_CLINIC_OR_DEPARTMENT_OTHER)
Admission: RE | Disposition: A | Discharge status: Home / Self Care | Payer: Self-pay | Source: Home / Self Care | Attending: Plastic Surgery

## 2020-09-10 ENCOUNTER — Other Ambulatory Visit: Payer: Self-pay

## 2020-09-10 DIAGNOSIS — N62 Hypertrophy of breast: Secondary | ICD-10-CM | POA: Insufficient documentation

## 2020-09-10 DIAGNOSIS — M549 Dorsalgia, unspecified: Secondary | ICD-10-CM | POA: Diagnosis not present

## 2020-09-10 DIAGNOSIS — M542 Cervicalgia: Secondary | ICD-10-CM | POA: Diagnosis not present

## 2020-09-10 DIAGNOSIS — Z79899 Other long term (current) drug therapy: Secondary | ICD-10-CM | POA: Insufficient documentation

## 2020-09-10 HISTORY — DX: Polycystic ovarian syndrome: E28.2

## 2020-09-10 HISTORY — PX: BREAST REDUCTION SURGERY: SHX8

## 2020-09-10 HISTORY — DX: Depression, unspecified: F32.A

## 2020-09-10 LAB — POCT PREGNANCY, URINE: Preg Test, Ur: NEGATIVE

## 2020-09-10 SURGERY — MAMMOPLASTY, REDUCTION
Anesthesia: General | Site: Breast | Laterality: Bilateral

## 2020-09-10 MED ORDER — HYDROMORPHONE HCL 1 MG/ML IJ SOLN
INTRAMUSCULAR | Status: AC
  Filled 2020-09-10: qty 0.5

## 2020-09-10 MED ORDER — SODIUM CHLORIDE 0.9% FLUSH: 3.0000 mL | Freq: Two times a day (BID) | INTRAVENOUS | Status: DC

## 2020-09-10 MED ORDER — OXYCODONE HCL 5 MG PO TABS
ORAL_TABLET | ORAL | Status: AC
  Filled 2020-09-10: qty 1

## 2020-09-10 MED ORDER — ACETAMINOPHEN 325 MG PO TABS: 650.0000 mg | ORAL_TABLET | ORAL | Status: DC | PRN

## 2020-09-10 MED ORDER — PROPOFOL 10 MG/ML IV BOLUS
INTRAVENOUS | Status: DC | PRN
  Administered 2020-09-10: 200 mg via INTRAVENOUS

## 2020-09-10 MED ORDER — MIDAZOLAM HCL 2 MG/2ML IJ SOLN
INTRAMUSCULAR | Status: AC
  Filled 2020-09-10: qty 2

## 2020-09-10 MED ORDER — MEPERIDINE HCL 25 MG/ML IJ SOLN
6.2500 mg | INTRAMUSCULAR | Status: DC | PRN
Start: 2020-09-10 — End: 2020-09-10

## 2020-09-10 MED ORDER — SUGAMMADEX SODIUM 500 MG/5ML IV SOLN
INTRAVENOUS | Status: DC | PRN
  Administered 2020-09-10: 200 mg via INTRAVENOUS

## 2020-09-10 MED ORDER — CHLORHEXIDINE GLUCONATE CLOTH 2 % EX PADS: 6.0000 | MEDICATED_PAD | Freq: Once | CUTANEOUS | Status: DC

## 2020-09-10 MED ORDER — SODIUM CHLORIDE 0.9% FLUSH: 3.0000 mL | INTRAVENOUS | Status: DC | PRN

## 2020-09-10 MED ORDER — AMISULPRIDE (ANTIEMETIC) 5 MG/2ML IV SOLN: 10.0000 mg | Freq: Once | INTRAVENOUS | Status: DC | PRN

## 2020-09-10 MED ORDER — SUFENTANIL CITRATE 50 MCG/ML IV SOLN
INTRAVENOUS | Status: AC
  Filled 2020-09-10: qty 1

## 2020-09-10 MED ORDER — OXYCODONE HCL 5 MG PO TABS: 5.0000 mg | ORAL_TABLET | ORAL | Status: DC | PRN

## 2020-09-10 MED ORDER — SUFENTANIL CITRATE 50 MCG/ML IV SOLN
INTRAVENOUS | Status: DC | PRN
  Administered 2020-09-10: 10 ug via INTRAVENOUS
  Administered 2020-09-10: 5 ug via INTRAVENOUS
  Administered 2020-09-10: 10 ug via INTRAVENOUS
  Administered 2020-09-10: 30 ug via INTRAVENOUS

## 2020-09-10 MED ORDER — ROCURONIUM BROMIDE 100 MG/10ML IV SOLN
INTRAVENOUS | Status: DC | PRN
  Administered 2020-09-10: 100 mg via INTRAVENOUS

## 2020-09-10 MED ORDER — SODIUM CHLORIDE 0.9 % IV SOLN: 250.0000 mL | INTRAVENOUS | Status: DC | PRN

## 2020-09-10 MED ORDER — PHENYLEPHRINE HCL (PRESSORS) 10 MG/ML IV SOLN
INTRAVENOUS | Status: DC | PRN
  Administered 2020-09-10 (×2): 80 ug via INTRAVENOUS

## 2020-09-10 MED ORDER — BUPIVACAINE HCL (PF) 0.25 % IJ SOLN
INTRAMUSCULAR | Status: DC | PRN
  Administered 2020-09-10: 20 mL

## 2020-09-10 MED ORDER — OXYCODONE HCL 5 MG PO TABS
5.0000 mg | ORAL_TABLET | Freq: Once | ORAL | Status: AC | PRN
  Administered 2020-09-10: 5 mg via ORAL

## 2020-09-10 MED ORDER — ACETAMINOPHEN 325 MG RE SUPP: 650.0000 mg | RECTAL | Status: DC | PRN

## 2020-09-10 MED ORDER — LIDOCAINE-EPINEPHRINE 1 %-1:100000 IJ SOLN
INTRAMUSCULAR | Status: DC | PRN
  Administered 2020-09-10: 20 mL

## 2020-09-10 MED ORDER — LACTATED RINGERS IV SOLN: INTRAVENOUS | Status: DC

## 2020-09-10 MED ORDER — LIDOCAINE HCL (CARDIAC) PF 100 MG/5ML IV SOSY
PREFILLED_SYRINGE | INTRAVENOUS | Status: DC | PRN
  Administered 2020-09-10: 100 mg via INTRAVENOUS

## 2020-09-10 MED ORDER — PROMETHAZINE HCL 25 MG/ML IJ SOLN: 6.2500 mg | INTRAMUSCULAR | Status: DC | PRN

## 2020-09-10 MED ORDER — HYDROMORPHONE HCL 1 MG/ML IJ SOLN
0.2500 mg | INTRAMUSCULAR | Status: DC | PRN
  Administered 2020-09-10 (×2): 0.25 mg via INTRAVENOUS

## 2020-09-10 MED ORDER — CEFAZOLIN SODIUM-DEXTROSE 2-4 GM/100ML-% IV SOLN
2.0000 g | INTRAVENOUS | Status: AC
  Administered 2020-09-10: 2 g via INTRAVENOUS

## 2020-09-10 MED ORDER — CEFAZOLIN SODIUM-DEXTROSE 2-4 GM/100ML-% IV SOLN
INTRAVENOUS | Status: AC
  Filled 2020-09-10: qty 100

## 2020-09-10 MED ORDER — FENTANYL CITRATE (PF) 100 MCG/2ML IJ SOLN: 25.0000 ug | INTRAMUSCULAR | Status: DC | PRN

## 2020-09-10 MED ORDER — DEXAMETHASONE SODIUM PHOSPHATE 4 MG/ML IJ SOLN
INTRAMUSCULAR | Status: DC | PRN
  Administered 2020-09-10: 10 mg via INTRAVENOUS

## 2020-09-10 MED ORDER — OXYCODONE HCL 5 MG/5ML PO SOLN: 5.0000 mg | Freq: Once | ORAL | Status: AC | PRN

## 2020-09-10 MED ORDER — MIDAZOLAM HCL 5 MG/5ML IJ SOLN
INTRAMUSCULAR | Status: DC | PRN
  Administered 2020-09-10: 2 mg via INTRAVENOUS

## 2020-09-10 SURGICAL SUPPLY — 63 items
ADH SKN CLS APL DERMABOND .7 (GAUZE/BANDAGES/DRESSINGS) ×2
BAG DECANTER FOR FLEXI CONT (MISCELLANEOUS) ×2 IMPLANT
BINDER BREAST LRG (GAUZE/BANDAGES/DRESSINGS) IMPLANT
BINDER BREAST MEDIUM (GAUZE/BANDAGES/DRESSINGS) IMPLANT
BINDER BREAST XLRG (GAUZE/BANDAGES/DRESSINGS) IMPLANT
BINDER BREAST XXLRG (GAUZE/BANDAGES/DRESSINGS) ×2 IMPLANT
BIOPATCH RED 1 DISK 7.0 (GAUZE/BANDAGES/DRESSINGS) IMPLANT
BLADE HEX COATED 2.75 (ELECTRODE) ×2 IMPLANT
BLADE KNIFE PERSONA 10 (BLADE) ×4 IMPLANT
BLADE SURG 15 STRL LF DISP TIS (BLADE) ×1 IMPLANT
BLADE SURG 15 STRL SS (BLADE) ×2
CANISTER SUCT 1200ML W/VALVE (MISCELLANEOUS) ×2 IMPLANT
COVER BACK TABLE 60X90IN (DRAPES) ×2 IMPLANT
COVER MAYO STAND STRL (DRAPES) ×2 IMPLANT
COVER WAND RF STERILE (DRAPES) IMPLANT
DECANTER SPIKE VIAL GLASS SM (MISCELLANEOUS) ×2 IMPLANT
DERMABOND ADVANCED (GAUZE/BANDAGES/DRESSINGS) ×2
DERMABOND ADVANCED .7 DNX12 (GAUZE/BANDAGES/DRESSINGS) ×2 IMPLANT
DRAIN CHANNEL 19F RND (DRAIN) IMPLANT
DRAPE LAPAROSCOPIC ABDOMINAL (DRAPES) ×2 IMPLANT
DRSG OPSITE POSTOP 4X12 (GAUZE/BANDAGES/DRESSINGS) ×4 IMPLANT
DRSG OPSITE POSTOP 4X6 (GAUZE/BANDAGES/DRESSINGS) ×2 IMPLANT
DRSG PAD ABDOMINAL 8X10 ST (GAUZE/BANDAGES/DRESSINGS) ×4 IMPLANT
ELECT BLADE 4.0 EZ CLEAN MEGAD (MISCELLANEOUS) ×2
ELECT REM PT RETURN 9FT ADLT (ELECTROSURGICAL) ×2
ELECTRODE BLDE 4.0 EZ CLN MEGD (MISCELLANEOUS) ×1 IMPLANT
ELECTRODE REM PT RTRN 9FT ADLT (ELECTROSURGICAL) ×1 IMPLANT
EVACUATOR SILICONE 100CC (DRAIN) IMPLANT
GLOVE SURG ENC MOIS LTX SZ6.5 (GLOVE) ×8 IMPLANT
GOWN STRL REUS W/ TWL LRG LVL3 (GOWN DISPOSABLE) ×2 IMPLANT
GOWN STRL REUS W/TWL LRG LVL3 (GOWN DISPOSABLE) ×4
NEEDLE FILTER BLUNT 18X 1/2SAF (NEEDLE)
NEEDLE FILTER BLUNT 18X1 1/2 (NEEDLE) IMPLANT
NEEDLE HYPO 25X1 1.5 SAFETY (NEEDLE) ×2 IMPLANT
NS IRRIG 1000ML POUR BTL (IV SOLUTION) ×2 IMPLANT
PACK BASIN DAY SURGERY FS (CUSTOM PROCEDURE TRAY) ×2 IMPLANT
PAD ALCOHOL SWAB (MISCELLANEOUS) IMPLANT
PAD FOAM SILICONE BACKED (GAUZE/BANDAGES/DRESSINGS) IMPLANT
PENCIL SMOKE EVACUATOR (MISCELLANEOUS) ×2 IMPLANT
PIN SAFETY STERILE (MISCELLANEOUS) IMPLANT
SLEEVE SCD COMPRESS KNEE MED (STOCKING) ×2 IMPLANT
SPONGE LAP 18X18 RF (DISPOSABLE) ×6 IMPLANT
STRIP SUTURE WOUND CLOSURE 1/2 (MISCELLANEOUS) ×6 IMPLANT
SUT MNCRL AB 4-0 PS2 18 (SUTURE) ×16 IMPLANT
SUT MON AB 3-0 SH 27 (SUTURE) ×16
SUT MON AB 3-0 SH27 (SUTURE) ×8 IMPLANT
SUT MON AB 5-0 PS2 18 (SUTURE) ×4 IMPLANT
SUT PDS 3-0 CT2 (SUTURE) ×12
SUT PDS AB 2-0 CT2 27 (SUTURE) IMPLANT
SUT PDS II 3-0 CT2 27 ABS (SUTURE) ×6 IMPLANT
SUT SILK 3 0 PS 1 (SUTURE) IMPLANT
SUT VIC AB 3-0 SH 27 (SUTURE)
SUT VIC AB 3-0 SH 27X BRD (SUTURE) IMPLANT
SUT VICRYL 4-0 PS2 18IN ABS (SUTURE) IMPLANT
SYR 50ML LL SCALE MARK (SYRINGE) IMPLANT
SYR BULB IRRIG 60ML STRL (SYRINGE) ×2 IMPLANT
SYR CONTROL 10ML LL (SYRINGE) ×2 IMPLANT
TAPE MEASURE VINYL STERILE (MISCELLANEOUS) IMPLANT
TOWEL GREEN STERILE FF (TOWEL DISPOSABLE) ×4 IMPLANT
TRAY DSU PREP LF (CUSTOM PROCEDURE TRAY) ×2 IMPLANT
TUBE CONNECTING 20X1/4 (TUBING) ×2 IMPLANT
UNDERPAD 30X36 HEAVY ABSORB (UNDERPADS AND DIAPERS) ×2 IMPLANT
YANKAUER SUCT BULB TIP NO VENT (SUCTIONS) ×2 IMPLANT

## 2020-09-10 NOTE — Anesthesia Postprocedure Evaluation (Signed)
Anesthesia Post Note  Patient: Advertising account executive  Procedure(s) Performed: MAMMARY REDUCTION  (BREAST) (Bilateral Breast)     Patient location during evaluation: PACU Anesthesia Type: General Level of consciousness: sedated Pain management: pain level controlled Vital Signs Assessment: post-procedure vital signs reviewed and stable Respiratory status: spontaneous breathing and respiratory function stable Cardiovascular status: stable Postop Assessment: no apparent nausea or vomiting Anesthetic complications: no   No complications documented.  Last Vitals:  Vitals:   09/10/20 1600 09/10/20 1615  BP: 120/82 120/79  Pulse: 72 67  Resp: 18 17  Temp:    SpO2: 98% 98%    Last Pain:  Vitals:   09/10/20 1615  TempSrc:   PainSc: Asleep                 Latayvia Mandujano DANIEL

## 2020-09-10 NOTE — Anesthesia Preprocedure Evaluation (Signed)
Anesthesia Evaluation  Patient identified by MRN, date of birth, ID band Patient awake    Reviewed: Allergy & Precautions, NPO status , Patient's Chart, lab work & pertinent test results  Airway Mallampati: II  TM Distance: >3 FB Neck ROM: Full    Dental no notable dental hx.    Pulmonary neg pulmonary ROS,    Pulmonary exam normal breath sounds clear to auscultation       Cardiovascular negative cardio ROS Normal cardiovascular exam Rhythm:Regular Rate:Normal     Neuro/Psych Depression negative neurological ROS  negative psych ROS   GI/Hepatic negative GI ROS, Neg liver ROS,   Endo/Other  negative endocrine ROS  Renal/GU negative Renal ROS  negative genitourinary   Musculoskeletal negative musculoskeletal ROS (+)   Abdominal (+) + obese,   Peds negative pediatric ROS (+)  Hematology negative hematology ROS (+)   Anesthesia Other Findings   Reproductive/Obstetrics negative OB ROS                             Anesthesia Physical Anesthesia Plan  ASA: II  Anesthesia Plan: General   Post-op Pain Management:    Induction: Intravenous  PONV Risk Score and Plan: 3 and Ondansetron, Dexamethasone, Midazolam and Treatment may vary due to age or medical condition  Airway Management Planned: Oral ETT  Additional Equipment:   Intra-op Plan:   Post-operative Plan: Extubation in OR  Informed Consent: I have reviewed the patients History and Physical, chart, labs and discussed the procedure including the risks, benefits and alternatives for the proposed anesthesia with the patient or authorized representative who has indicated his/her understanding and acceptance.     Dental advisory given  Plan Discussed with: CRNA  Anesthesia Plan Comments:         Anesthesia Quick Evaluation

## 2020-09-10 NOTE — Discharge Instructions (Signed)
INSTRUCTIONS FOR AFTER SURGERY   You will likely have some questions about what to expect following your operation.  The following information will help you and your family understand what to expect when you are discharged from the hospital.  Following these guidelines will help ensure a smooth recovery and reduce risks of complications.  Postoperative instructions include information on: diet, wound care, medications and physical activity.  AFTER SURGERY Expect to go home after the procedure.  In some cases, you may need to spend one night in the hospital for observation.  DIET This surgery does not require a specific diet.  However, I have to mention that the healthier you eat the better your body can start healing. It is important to increasing your protein intake.  This means limiting the foods with added sugar.  Focus on fruits and vegetables and some meat. It is very important to drink water after your surgery.  If your urine is bright yellow, then it is concentrated, and you need to drink more water.  As a general rule after surgery, you should have 8 ounces of water every hour while awake.  If you find you are persistently nauseated or unable to take in liquids let us know.  NO TOBACCO USE or EXPOSURE.  This will slow your healing process and increase the risk of a wound.  WOUND CARE If you don't have a drain: You can shower the day after surgery.  Use fragrance free soap.  Dial, Dove, Ivory and Cetaphil are usually mild on the skin.  If you have a drain: Clean with baby wipes for 3-5 days and then you can shower.  If you have a binder you may remove it to shower and then put it back on. If you have steri-strips / tape directly attached to your skin leave them in place. It is OK to get these wet.  No baths, pools or hot tubs for two weeks. We close your incision to leave the smallest and best-looking scar. No ointment or creams on your incisions until given the go ahead.  Especially not  Neosporin (Too many skin reactions with this one).  A few weeks after surgery you can use Mederma and start massaging the scar. We ask you to wear your binder or sports bra for the first 6 weeks around the clock, including while sleeping. This provides added comfort and helps reduce the fluid accumulation at the surgery site.  ACTIVITY No heavy lifting until cleared by the doctor.  It is OK to walk and climb stairs. In fact, moving your legs is very important to decrease your risk of a blood clot.  It will also help keep you from getting deconditioned.  Every 1 to 2 hours get up and walk for 5 minutes. This will help with a quicker recovery back to normal.  Let pain be your guide so you don't do too much.  NO, you cannot do the spring cleaning and don't plan on taking care of anyone else.  This is your time for TLC.   WORK Everyone returns to work at different times. As a rough guide, most people take at least 1 - 2 weeks off prior to returning to work. If you need documentation for your job, bring the forms to your postoperative follow up visit.  DRIVING Arrange for someone to bring you home from the hospital.  You may be able to drive a few days after surgery but not while taking any narcotics or valium.  BOWEL   MOVEMENTS Constipation can occur after anesthesia and while taking pain medication.  It is important to stay ahead for your comfort.  We recommend taking Milk of Magnesia (2 tablespoons; twice a day) while taking the pain pills.  SEROMA This is fluid your body tried to put in the surgical site.  This is normal but if it creates excessive pain and swelling let us know.  It usually decreases in a few weeks.  MEDICATIONS and PAIN CONTROL At your preoperative visit for you history and physical you were given the following medications: 1. An antibiotic: Start this medication when you get home and take according to the instructions on the bottle. 2. Zofran 4 mg:  This is to treat nausea and  vomiting.  You can take this every 6 hours as needed and only if needed. 3. Norco (hydrocodone/acetaminophen) 5/325 mg:  This is only to be used after you have taken the motrin or the tylenol. Every 8 hours as needed. Over the counter Medication to take: 4. Ibuprofen (Motrin) 600 mg:  Take this every 6 hours.  If you have additional pain then take 500 mg of the tylenol.  Only take the Norco after you have tried these two. 5. Miralax or stool softener of choice: Take this according to the bottle if you take the Norco.  WHEN TO CALL Call your surgeon's office if any of the following occur: . Fever 101 degrees F or greater . Excessive bleeding or fluid from the incision site. . Pain that increases over time without aid from the medications . Redness, warmth, or pus draining from incision sites . Persistent nausea or inability to take in liquids . Severe misshapen area that underwent the operation.   Post Anesthesia Home Care Instructions  Activity: Get plenty of rest for the remainder of the day. A responsible individual must stay with you for 24 hours following the procedure.  For the next 24 hours, DO NOT: -Drive a car -Operate machinery -Drink alcoholic beverages -Take any medication unless instructed by your physician -Make any legal decisions or sign important papers.  Meals: Start with liquid foods such as gelatin or soup. Progress to regular foods as tolerated. Avoid greasy, spicy, heavy foods. If nausea and/or vomiting occur, drink only clear liquids until the nausea and/or vomiting subsides. Call your physician if vomiting continues.  Special Instructions/Symptoms: Your throat may feel dry or sore from the anesthesia or the breathing tube placed in your throat during surgery. If this causes discomfort, gargle with warm salt water. The discomfort should disappear within 24 hours.  If you had a scopolamine patch placed behind your ear for the management of post- operative nausea  and/or vomiting:  1. The medication in the patch is effective for 72 hours, after which it should be removed.  Wrap patch in a tissue and discard in the trash. Wash hands thoroughly with soap and water. 2. You may remove the patch earlier than 72 hours if you experience unpleasant side effects which may include dry mouth, dizziness or visual disturbances. 3. Avoid touching the patch. Wash your hands with soap and water after contact with the patch.     

## 2020-09-10 NOTE — Interval H&P Note (Signed)
History and Physical Interval Note:  09/10/2020 11:25 AM  Ashley Page  has presented today for surgery, with the diagnosis of mammary hypertrophy.  The various methods of treatment have been discussed with the patient and family. After consideration of risks, benefits and other options for treatment, the patient has consented to  Procedure(s) with comments: BREAST REDUCTION WITH LIPOSUCTION (Bilateral) - 3 hours, please as a surgical intervention.  The patient's history has been reviewed, patient examined, no change in status, stable for surgery.  I have reviewed the patient's chart and labs.  Questions were answered to the patient's satisfaction.     Alena Bills Ladarren Steiner

## 2020-09-10 NOTE — Anesthesia Procedure Notes (Signed)
Procedure Name: Intubation Date/Time: 09/10/2020 12:09 PM Performed by: Willa Frater, CRNA Pre-anesthesia Checklist: Patient identified, Emergency Drugs available, Suction available and Patient being monitored Patient Re-evaluated:Patient Re-evaluated prior to induction Oxygen Delivery Method: Circle system utilized Preoxygenation: Pre-oxygenation with 100% oxygen Induction Type: IV induction Ventilation: Mask ventilation without difficulty Laryngoscope Size: Mac and 3 Grade View: Grade I Tube type: Oral Number of attempts: 1 Airway Equipment and Method: Stylet and Oral airway Placement Confirmation: ETT inserted through vocal cords under direct vision,  positive ETCO2 and breath sounds checked- equal and bilateral Secured at: 22 cm Tube secured with: Tape Dental Injury: Teeth and Oropharynx as per pre-operative assessment

## 2020-09-10 NOTE — Transfer of Care (Signed)
Immediate Anesthesia Transfer of Care Note  Patient: Ashley Page  Procedure(s) Performed: MAMMARY REDUCTION  (BREAST) (Bilateral Breast)  Patient Location: PACU  Anesthesia Type:General  Level of Consciousness: awake, alert , oriented, drowsy and patient cooperative  Airway & Oxygen Therapy: Patient Spontanous Breathing and Patient connected to face mask oxygen  Post-op Assessment: Report given to RN and Post -op Vital signs reviewed and stable  Post vital signs: Reviewed and stable  Last Vitals:  Vitals Value Taken Time  BP    Temp    Pulse    Resp    SpO2      Last Pain:  Vitals:   09/10/20 0943  TempSrc: Oral  PainSc: 0-No pain      Patients Stated Pain Goal: 4 (09/10/20 0943)  Complications: No complications documented.

## 2020-09-10 NOTE — Op Note (Signed)
Breast Reduction Op note:    DATE OF PROCEDURE: 09/10/2020  LOCATION: [facility]  SURGEON: Alan Ripper Sanger Dillingham, DO  ASSISTANT: Keenan Bachelor, PA  PREOPERATIVE DIAGNOSIS 1. Macromastia 2. Neck Pain 3. Back Pain  POSTOPERATIVE DIAGNOSIS 1. Macromastia 2. Neck Pain 3. Back Pain  PROCEDURES 1. Bilateral breast reduction.  Right reduction 1216g, Left reduction 1276g  COMPLICATIONS: None.  DRAINS: none  INDICATIONS FOR PROCEDURE Ashley Page is a 25 y.o. year-old female born on 03/31/1996,with a history of symptomatic macromastia with concominant back pain, neck pain, shoulder grooving from her bra.   MRN: [id number]  CONSENT Informed consent was obtained directly from the patient. The risks, benefits and alternatives were fully discussed. Specific risks including but not limited to bleeding, infection, hematoma, seroma, scarring, pain, nipple necrosis, asymmetry, poor cosmetic results, and need for further surgery were discussed. The patient had ample opportunity to have her questions answered to her satisfaction.  DESCRIPTION OF PROCEDURE  Patient was brought into the operating room and placed in a supine position.  SCDs were placed and appropriate padding was performed.  Antibiotics were given. The patient underwent general anesthesia and the chest was prepped and draped in a sterile fashion.  A timeout was performed and all information was confirmed to be correct.  Right side: Preoperative markings were confirmed.  Incision lines were injected with 1% Xylocaine with epinephrine.  After waiting for vasoconstriction, the marked lines were incised.  A Wise-pattern superomedial breast reduction was performed by de-epithelializing the pedicle, using bovie to create the superomedial pedicle, and removing breast tissue from the lateral and inferior portions of the breast.  Care was taken to not undermine the breast pedicle. Hemostasis was achieved.  The  nipple was gently rotated into position and the soft tissue closed with 4-0 Monocryl.   The pocket was irrigated and hemostasis confirmed.  The deep tissues were approximated with 3-0 Monocryl and PDS sutures and the skin was closed with deep dermal and subcuticular 4-0 Monocryl sutures.  The nipple and skin flaps had good capillary refill at the end of the procedure.    Left side: Preoperative markings were confirmed.  Incision lines were injected with 1% Xylocaine with epinephrine.  After waiting for vasoconstriction, the marked lines were incised.  A Wise-pattern superomedial breast reduction was performed by de-epithelializing the pedicle, using bovie to create the superomedial pedicle, and removing breast tissue from the lateral and inferior portions of the breast.  Care was taken to not undermine the breast pedicle. Hemostasis was achieved.  The nipple was gently rotated into position and the soft tissue was closed with 4-0 Monocryl.  The patient was sat upright and size and shape symmetry was confirmed.  The pocket was irrigated and hemostasis confirmed.  The deep tissues were approximated with 3-0 Monocryl and PDS sutures and the skin was closed with deep dermal and subcuticular 4-0 Monocryl sutures.  Dermabond was applied.  A breast binder and ABDs were placed.  The nipple and skin flaps had good capillary refill at the end of the procedure.  The patient tolerated the procedure well. The patient was allowed to wake from anesthesia and taken to the recovery room in satisfactory condition.  The advanced practice practitioner (APP) assisted throughout the case.  The APP was essential in retraction and counter traction when needed to make the case progress smoothly.  This retraction and assistance made it possible to see the tissue plans for the procedure.  The assistance was needed for  blood control, tissue re-approximation and assisted with closure of the incision site.

## 2020-09-11 ENCOUNTER — Encounter (HOSPITAL_BASED_OUTPATIENT_CLINIC_OR_DEPARTMENT_OTHER): Payer: Self-pay | Admitting: Plastic Surgery

## 2020-09-11 LAB — SURGICAL PATHOLOGY

## 2020-09-19 ENCOUNTER — Other Ambulatory Visit: Payer: Self-pay

## 2020-09-19 ENCOUNTER — Encounter: Payer: Self-pay | Admitting: Plastic Surgery

## 2020-09-19 ENCOUNTER — Ambulatory Visit (INDEPENDENT_AMBULATORY_CARE_PROVIDER_SITE_OTHER): Payer: No Typology Code available for payment source | Admitting: Plastic Surgery

## 2020-09-19 VITALS — BP 107/71 | HR 82

## 2020-09-19 DIAGNOSIS — N62 Hypertrophy of breast: Secondary | ICD-10-CM

## 2020-09-19 NOTE — Progress Notes (Signed)
The patient is a 25 year old female here for follow-up on her breast reduction from 09/10/2020.  She had over 1200 g removed from each breast.  She has a little bit of swelling and bruising but it is minimal.  I do not see any sign of a hematoma or seroma.  She is very pleased with her progress.  She does not have any complaints.  Her pain is well controlled.  She can go into a sports bra and follow-up in 2 to 3 weeks.

## 2020-09-25 NOTE — Progress Notes (Signed)
25 year old female here for follow-up after bilateral breast reduction with Dr. Ulice Bold on 09/10/2020.  She is doing well.  She reports her bowels are moving normally.  She is not having any infectious symptoms.  She is very pleased  Chaperone present on exam On exam bilateral breast incisions appear intact, bilateral NAC's are viable.  There is no erythema noted.  No subcutaneous fluid collections are noted.  Bilateral breasts are symmetric.  No sign of infection, seroma, hematoma. Recommend following up in 4 weeks for reevaluation.  Recommend calling with any questions or concerns. I discussed with her we could see her sooner if she notices any changes that concern her. Recommend continue avoid strenuous activity.  Recommend continue her compressive garment 24/7 for another month.

## 2020-09-26 ENCOUNTER — Other Ambulatory Visit: Payer: Self-pay

## 2020-09-26 ENCOUNTER — Ambulatory Visit (INDEPENDENT_AMBULATORY_CARE_PROVIDER_SITE_OTHER): Payer: No Typology Code available for payment source | Admitting: Surgical

## 2020-09-26 ENCOUNTER — Encounter: Payer: Self-pay | Admitting: Surgical

## 2020-09-26 ENCOUNTER — Other Ambulatory Visit (HOSPITAL_COMMUNITY): Payer: Self-pay

## 2020-09-26 VITALS — BP 109/76 | HR 78

## 2020-09-26 DIAGNOSIS — M546 Pain in thoracic spine: Secondary | ICD-10-CM

## 2020-09-26 DIAGNOSIS — N62 Hypertrophy of breast: Secondary | ICD-10-CM

## 2020-09-26 DIAGNOSIS — G8929 Other chronic pain: Secondary | ICD-10-CM

## 2020-09-26 DIAGNOSIS — M542 Cervicalgia: Secondary | ICD-10-CM

## 2020-09-26 MED FILL — Sertraline HCl Tab 50 MG: ORAL | 30 days supply | Qty: 30 | Fill #0 | Status: AC

## 2020-10-28 ENCOUNTER — Ambulatory Visit: Payer: No Typology Code available for payment source | Admitting: Surgical

## 2020-10-28 NOTE — Progress Notes (Deleted)
Patient is a 25 year old female here for follow-up after bilateral breast reduction with Dr. Ulice Bold on 09/10/2020.  She is 7 weeks postop.

## 2020-11-03 ENCOUNTER — Ambulatory Visit: Payer: No Typology Code available for payment source | Admitting: Surgical

## 2020-11-11 ENCOUNTER — Ambulatory Visit (INDEPENDENT_AMBULATORY_CARE_PROVIDER_SITE_OTHER): Payer: No Typology Code available for payment source | Admitting: Surgical

## 2020-11-11 ENCOUNTER — Other Ambulatory Visit: Payer: Self-pay

## 2020-11-11 DIAGNOSIS — N62 Hypertrophy of breast: Secondary | ICD-10-CM

## 2020-11-11 NOTE — Progress Notes (Signed)
25 year old female here for follow-up after bilateral breast reduction with Dr. Ulice Bold on 09/10/2020.  She is 2 months postop.  She reports overall she is doing really well.  She feels as if there is one area on her right breast where there may be a protruding suture.  She is very pleased with her recovery thus far.  Chaperone present on exam On exam bilateral breast incisions are intact, bilateral NAC's are viable.  No wounds are noted.  In the area of concern it feels as if there may be a small suture knot just beneath the skin but the skin is closed.  There is no erythema.  No cellulitic changes noted.  No subcutaneous fluid collections noted  No restrictions at this time.  Recommend continue wear sports bra for 1 more month then she can transition into wearing a normal bra without an underwire.  Recommend following up on an as-needed basis.  Recommend calling with any questions or concerns.  Pictures were taken and placed in the patient's chart with patient's permission.  We reviewed preop photos today.

## 2020-11-25 ENCOUNTER — Other Ambulatory Visit (HOSPITAL_COMMUNITY): Payer: Self-pay

## 2020-11-25 MED ORDER — SERTRALINE HCL 50 MG PO TABS
ORAL_TABLET | ORAL | 2 refills | Status: AC
  Filled 2020-11-25: qty 86, 90d supply, fill #0

## 2020-12-08 ENCOUNTER — Other Ambulatory Visit (HOSPITAL_COMMUNITY): Payer: Self-pay

## 2020-12-08 MED ORDER — SERTRALINE HCL 100 MG PO TABS
ORAL_TABLET | ORAL | 6 refills | Status: AC
  Filled 2021-05-18: qty 30, 30d supply, fill #0
  Filled 2021-06-26: qty 30, 30d supply, fill #1
  Filled 2021-07-30: qty 30, 30d supply, fill #2
  Filled 2021-09-15: qty 30, 30d supply, fill #3
  Filled 2021-11-02 – 2021-11-17 (×2): qty 30, 30d supply, fill #4

## 2021-05-18 ENCOUNTER — Other Ambulatory Visit (HOSPITAL_COMMUNITY): Payer: Self-pay

## 2021-06-26 ENCOUNTER — Other Ambulatory Visit (HOSPITAL_COMMUNITY): Payer: Self-pay

## 2021-07-31 ENCOUNTER — Other Ambulatory Visit (HOSPITAL_COMMUNITY): Payer: Self-pay

## 2021-09-15 ENCOUNTER — Other Ambulatory Visit (HOSPITAL_COMMUNITY): Payer: Self-pay

## 2021-11-02 ENCOUNTER — Other Ambulatory Visit (HOSPITAL_COMMUNITY): Payer: Self-pay

## 2021-11-10 ENCOUNTER — Other Ambulatory Visit (HOSPITAL_COMMUNITY): Payer: Self-pay

## 2021-11-17 ENCOUNTER — Other Ambulatory Visit (HOSPITAL_COMMUNITY): Payer: Self-pay

## 2021-12-02 ENCOUNTER — Other Ambulatory Visit (HOSPITAL_COMMUNITY): Payer: Self-pay

## 2021-12-02 MED ORDER — SERTRALINE HCL 100 MG PO TABS
100.0000 mg | ORAL_TABLET | Freq: Every day | ORAL | 0 refills | Status: AC
  Filled 2021-12-02 – 2021-12-29 (×2): qty 90, 90d supply, fill #0

## 2021-12-29 ENCOUNTER — Other Ambulatory Visit (HOSPITAL_COMMUNITY): Payer: Self-pay

## 2022-02-22 IMAGING — DX DG ANKLE COMPLETE 3+V*R*
3 series · 3 of 3 positions shown · non-contrast
Comparison: None.

CLINICAL DATA: Pain following fall

EXAM:
RIGHT ANKLE - COMPLETE 3+ VIEW

[ankle ap]
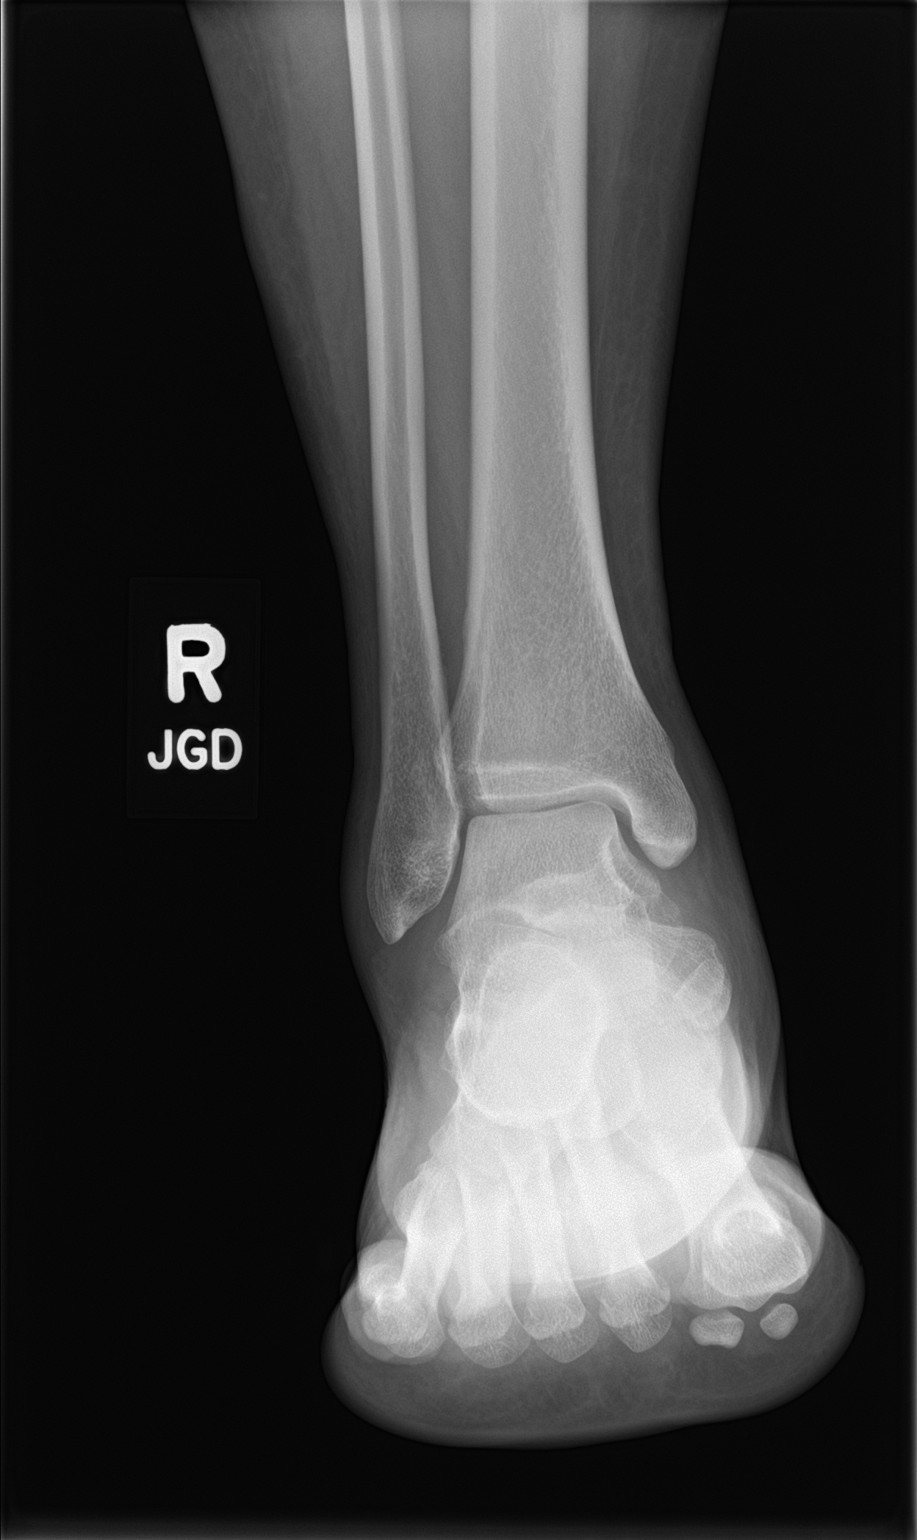

[ankle obl]
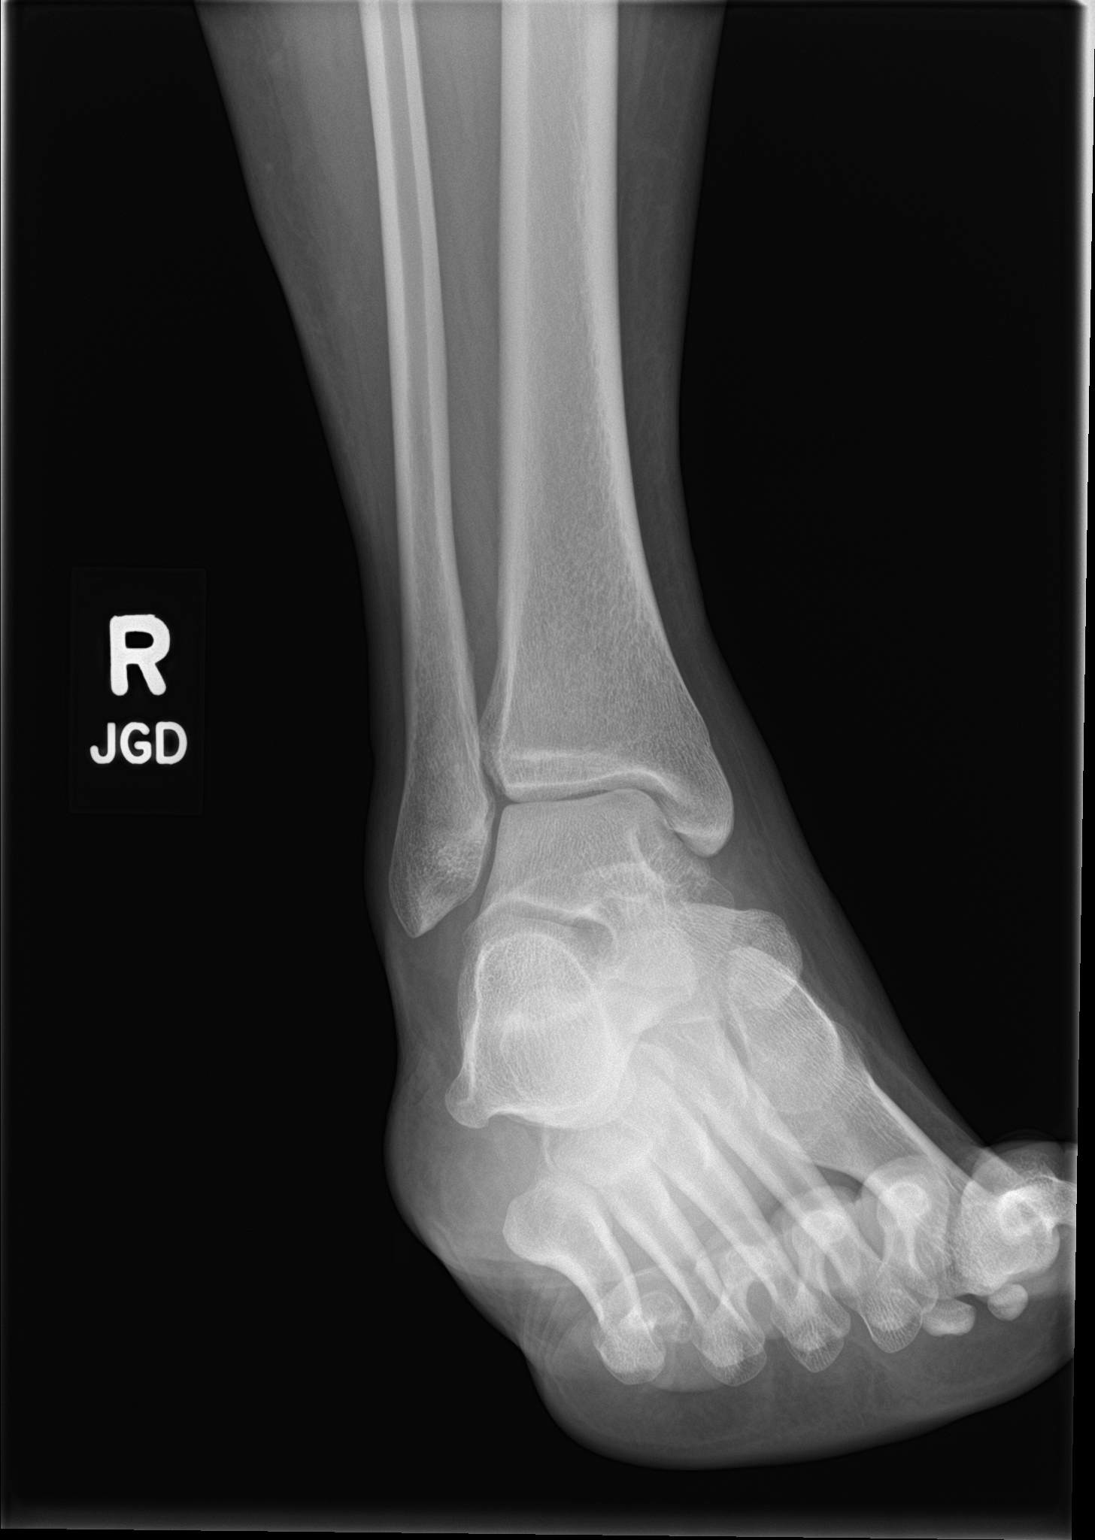

[ankle lat]
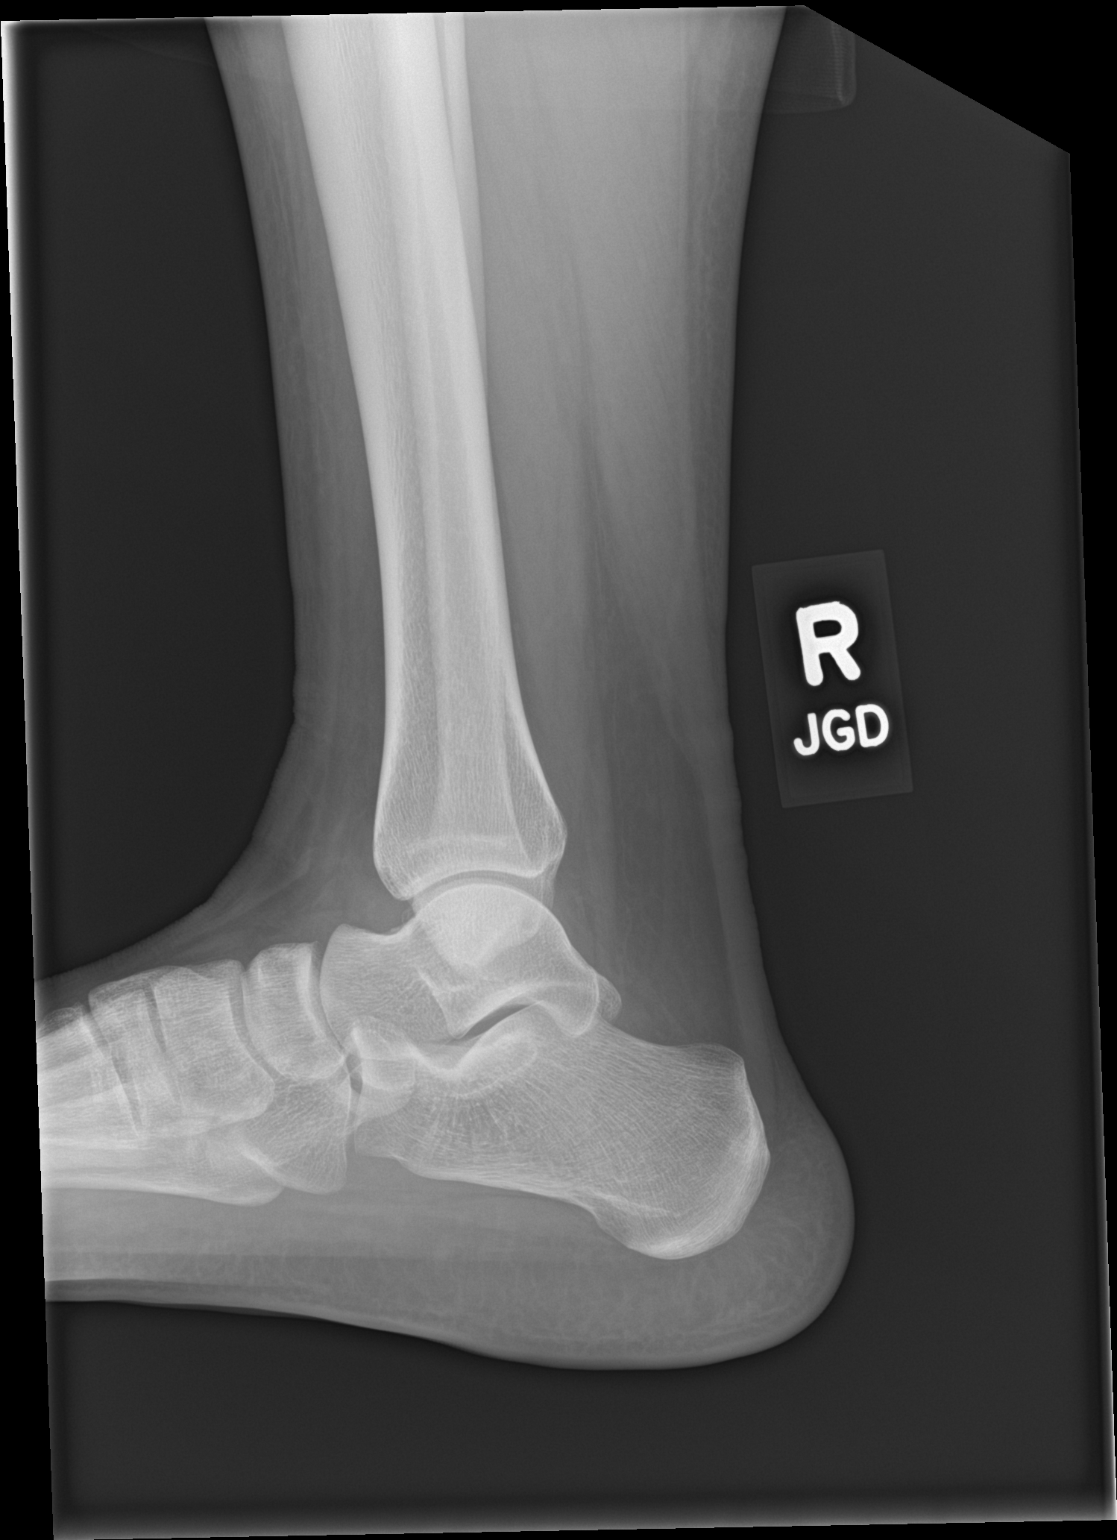

[3 of 3 positions shown; findings below may reference images not displayed]

FINDINGS: Frontal, oblique, and lateral views were obtained. There is no
appreciable fracture or joint effusion. Joint spaces appear normal.
No erosive change. Ankle mortise appears intact.
IMPRESSION: No fracture or appreciable arthropathy. Ankle mortise appears
intact.

## 2022-04-21 ENCOUNTER — Other Ambulatory Visit (HOSPITAL_COMMUNITY): Payer: Self-pay

## 2022-04-21 MED ORDER — SERTRALINE HCL 100 MG PO TABS
100.0000 mg | ORAL_TABLET | Freq: Every day | ORAL | 4 refills | Status: DC
  Filled 2022-04-21: qty 90, 90d supply, fill #0
  Filled 2022-08-24: qty 90, 90d supply, fill #1

## 2022-06-10 ENCOUNTER — Other Ambulatory Visit: Payer: Self-pay

## 2022-08-24 ENCOUNTER — Other Ambulatory Visit (HOSPITAL_COMMUNITY): Payer: Self-pay

## 2022-09-29 ENCOUNTER — Other Ambulatory Visit (HOSPITAL_COMMUNITY): Payer: Self-pay

## 2022-09-29 MED ORDER — SERTRALINE HCL 100 MG PO TABS
150.0000 mg | ORAL_TABLET | Freq: Every day | ORAL | 4 refills | Status: AC
  Filled 2022-09-29 – 2022-11-19 (×2): qty 135, 90d supply, fill #0
  Filled 2023-08-29: qty 135, 90d supply, fill #1

## 2022-11-19 ENCOUNTER — Other Ambulatory Visit (HOSPITAL_COMMUNITY): Payer: Self-pay

## 2022-11-19 ENCOUNTER — Other Ambulatory Visit: Payer: Self-pay

## 2022-11-22 ENCOUNTER — Other Ambulatory Visit (HOSPITAL_COMMUNITY): Payer: Self-pay

## 2022-11-22 MED ORDER — SERTRALINE HCL 100 MG PO TABS
150.0000 mg | ORAL_TABLET | Freq: Every day | ORAL | 4 refills | Status: AC
Start: 2022-11-22 — End: ?
  Filled 2022-11-22: qty 135, 90d supply, fill #0

## 2023-09-06 ENCOUNTER — Other Ambulatory Visit: Payer: Self-pay
